# Patient Record
Sex: Male | Born: 1960 | Race: White | Hispanic: No | Marital: Married | Smoking: Former smoker
Health system: Southern US, Community
[De-identification: ages and names within clinical notes are randomized; demographics above are authoritative.]

## PROBLEM LIST (undated history)

## (undated) DIAGNOSIS — F419 Anxiety disorder, unspecified: Secondary | ICD-10-CM

---

## 2003-12-30 ENCOUNTER — Ambulatory Visit: Payer: Self-pay | Admitting: Internal Medicine

## 2004-01-08 ENCOUNTER — Ambulatory Visit: Payer: Self-pay | Admitting: Internal Medicine

## 2004-03-09 ENCOUNTER — Ambulatory Visit: Payer: Self-pay | Admitting: Gastroenterology

## 2004-03-16 ENCOUNTER — Ambulatory Visit: Payer: Self-pay | Admitting: Gastroenterology

## 2004-09-14 ENCOUNTER — Ambulatory Visit: Payer: Self-pay | Admitting: Internal Medicine

## 2005-07-16 ENCOUNTER — Ambulatory Visit: Payer: Self-pay | Admitting: Internal Medicine

## 2005-12-20 ENCOUNTER — Ambulatory Visit: Payer: Self-pay | Admitting: Internal Medicine

## 2006-08-30 ENCOUNTER — Ambulatory Visit: Payer: Self-pay | Admitting: Internal Medicine

## 2006-09-09 ENCOUNTER — Encounter: Admission: RE | Admit: 2006-09-09 | Discharge: 2006-11-14 | Payer: Self-pay | Admitting: Internal Medicine

## 2007-05-22 DIAGNOSIS — G43909 Migraine, unspecified, not intractable, without status migrainosus: Secondary | ICD-10-CM | POA: Insufficient documentation

## 2007-05-22 DIAGNOSIS — K644 Residual hemorrhoidal skin tags: Secondary | ICD-10-CM | POA: Insufficient documentation

## 2007-05-22 DIAGNOSIS — G4700 Insomnia, unspecified: Secondary | ICD-10-CM | POA: Insufficient documentation

## 2007-05-22 DIAGNOSIS — I10 Essential (primary) hypertension: Secondary | ICD-10-CM | POA: Insufficient documentation

## 2007-05-26 ENCOUNTER — Encounter: Payer: Self-pay | Admitting: Internal Medicine

## 2008-03-08 ENCOUNTER — Ambulatory Visit: Payer: Self-pay | Admitting: Internal Medicine

## 2008-03-12 ENCOUNTER — Encounter: Payer: Self-pay | Admitting: Internal Medicine

## 2008-05-23 ENCOUNTER — Telehealth: Payer: Self-pay | Admitting: Internal Medicine

## 2008-07-02 ENCOUNTER — Ambulatory Visit (HOSPITAL_BASED_OUTPATIENT_CLINIC_OR_DEPARTMENT_OTHER): Admission: RE | Admit: 2008-07-02 | Discharge: 2008-07-02 | Payer: Self-pay | Admitting: Internal Medicine

## 2008-07-02 ENCOUNTER — Telehealth: Payer: Self-pay | Admitting: Internal Medicine

## 2008-07-02 ENCOUNTER — Ambulatory Visit: Payer: Self-pay | Admitting: Internal Medicine

## 2008-07-02 ENCOUNTER — Ambulatory Visit: Payer: Self-pay | Admitting: Diagnostic Radiology

## 2008-07-02 DIAGNOSIS — M79609 Pain in unspecified limb: Secondary | ICD-10-CM | POA: Insufficient documentation

## 2008-09-17 ENCOUNTER — Ambulatory Visit: Payer: Self-pay | Admitting: Diagnostic Radiology

## 2008-09-17 ENCOUNTER — Emergency Department (HOSPITAL_BASED_OUTPATIENT_CLINIC_OR_DEPARTMENT_OTHER): Admission: EM | Admit: 2008-09-17 | Discharge: 2008-09-17 | Payer: Self-pay | Admitting: Emergency Medicine

## 2008-11-25 ENCOUNTER — Ambulatory Visit: Payer: Self-pay | Admitting: Internal Medicine

## 2008-11-25 ENCOUNTER — Ambulatory Visit: Payer: Self-pay | Admitting: Diagnostic Radiology

## 2008-11-25 ENCOUNTER — Ambulatory Visit (HOSPITAL_BASED_OUTPATIENT_CLINIC_OR_DEPARTMENT_OTHER): Admission: RE | Admit: 2008-11-25 | Discharge: 2008-11-25 | Payer: Self-pay | Admitting: Internal Medicine

## 2008-11-25 ENCOUNTER — Telehealth: Payer: Self-pay | Admitting: Internal Medicine

## 2008-12-02 ENCOUNTER — Encounter: Payer: Self-pay | Admitting: Internal Medicine

## 2009-02-11 ENCOUNTER — Encounter (INDEPENDENT_AMBULATORY_CARE_PROVIDER_SITE_OTHER): Payer: Self-pay | Admitting: *Deleted

## 2009-03-01 HISTORY — PX: KNEE ARTHROSCOPY: SUR90

## 2009-10-14 ENCOUNTER — Telehealth: Payer: Self-pay | Admitting: Gastroenterology

## 2009-10-30 ENCOUNTER — Ambulatory Visit: Payer: Self-pay | Admitting: Internal Medicine

## 2009-10-30 DIAGNOSIS — S335XXA Sprain of ligaments of lumbar spine, initial encounter: Secondary | ICD-10-CM | POA: Insufficient documentation

## 2009-11-11 ENCOUNTER — Ambulatory Visit: Payer: Self-pay | Admitting: Family

## 2009-11-11 ENCOUNTER — Telehealth: Payer: Self-pay | Admitting: Family

## 2009-11-11 ENCOUNTER — Ambulatory Visit (HOSPITAL_BASED_OUTPATIENT_CLINIC_OR_DEPARTMENT_OTHER): Admission: RE | Admit: 2009-11-11 | Discharge: 2009-11-11 | Payer: Self-pay | Admitting: Internal Medicine

## 2009-11-11 ENCOUNTER — Ambulatory Visit: Payer: Self-pay | Admitting: Interventional Radiology

## 2009-11-11 DIAGNOSIS — L03039 Cellulitis of unspecified toe: Secondary | ICD-10-CM

## 2009-11-11 DIAGNOSIS — L02619 Cutaneous abscess of unspecified foot: Secondary | ICD-10-CM | POA: Insufficient documentation

## 2009-11-21 ENCOUNTER — Ambulatory Visit (HOSPITAL_BASED_OUTPATIENT_CLINIC_OR_DEPARTMENT_OTHER): Admission: RE | Admit: 2009-11-21 | Discharge: 2009-11-21 | Payer: Self-pay | Admitting: Internal Medicine

## 2009-11-21 ENCOUNTER — Ambulatory Visit: Payer: Self-pay | Admitting: Family

## 2009-11-21 ENCOUNTER — Ambulatory Visit: Payer: Self-pay | Admitting: Diagnostic Radiology

## 2010-02-03 ENCOUNTER — Encounter: Payer: Self-pay | Admitting: Internal Medicine

## 2010-03-29 LAB — CONVERTED CEMR LAB
ALT: 54 units/L — ABNORMAL HIGH (ref 0–53)
AST: 36 units/L (ref 0–37)
Basophils Absolute: 0 10*3/uL (ref 0.0–0.1)
Bilirubin, Direct: 0.2 mg/dL (ref 0.0–0.3)
CO2: 34 meq/L — ABNORMAL HIGH (ref 19–32)
Chloride: 107 meq/L (ref 96–112)
Lymphocytes Relative: 33.5 % (ref 12.0–46.0)
MCHC: 35.6 g/dL (ref 30.0–36.0)
Neutrophils Relative %: 52.9 % (ref 43.0–77.0)
Potassium: 4.6 meq/L (ref 3.5–5.1)
RDW: 11.9 % (ref 11.5–14.6)
Sodium: 145 meq/L (ref 135–145)
TSH: 2.19 microintl units/mL (ref 0.35–5.50)
Total Bilirubin: 2.1 mg/dL — ABNORMAL HIGH (ref 0.3–1.2)

## 2010-03-31 NOTE — Progress Notes (Signed)
Summary: u/s results  Phone Note Outgoing Call Call back at cell [phone]   Summary of Call: Please call Adam Stewart at above cell number with u/s results. Nicki Guadalajara Fergerson CMA Duncan Dull)  November 11, 2009 10:48 AM   Follow-up for Phone Call        ultrasound was negative for DVT. Pls notify patient. Follow-up by: Lemont Fillers FNP,  November 11, 2009 3:30 PM  Additional Follow-up for Phone Call Additional follow up Details #1::        Pt notified. Nicki Guadalajara Fergerson CMA Duncan Dull)  November 11, 2009 3:38 PM

## 2010-03-31 NOTE — Assessment & Plan Note (Signed)
Summary: TOE IS PURPLE/MHF   Vital Signs:  Patient profile:   50 year old male Weight:      221.25 pounds BMI:     32.79 O2 Sat:      98 % on Room air Temp:     98.2 degrees F oral Pulse rate:   87 / minute Pulse rhythm:   regular Resp:     18 per minute BP sitting:   130 / 90  (right arm) Cuff size:   large  Vitals Entered By: Glendell Docker CMA (November 21, 2009 3:21 PM)  O2 Flow:  Room air CC: follow-up visit Is Patient Diabetic? No Pain Assessment Patient in pain? no      Comments Evaluation of left middle, it has purple for the past 3 days, pain to touch, when preesure applied   Primary Care Provider:  D. Thomos Lemons DO  CC:  follow-up visit.  History of Present Illness: Adam Stewart is a 50 year old male who presents today for follow up of his left 3rd toe cellulitis.  He was originally seen on 9/13 and was treated with Keflex.  He also underwent LLE doppler which was negative for DVT.  Since that time he notes that the toe remains swollen and is purple in color.  He denies fever, drainage or pain- unless the toe is tightly squeezed.    Preventive Screening-Counseling & Management  Alcohol-Tobacco     Smoking Status: quit  Allergies: 1)  ! Wellbutrin  Past History:  Past Medical History: Last updated: 10/30/2009 Insomnia      Past Surgical History: Last updated: 10/30/2009 colonoscopy 2006    Physical Exam  General:  Well-developed,well-nourished,in no acute distress; alert,appropriate and cooperative throughout examination Pulses:  2+DP/PT pulse LLE.  Brisk cap refill of all toes on LLE including affected toe. Extremities:  left 3rd toe is mildly swollen, + light red/purple erythema is noted.  Some dry skin noted at base of nail bed.  Full range of motion Neurologic:  alert & oriented X3.     Impression & Recommendations:  Problem # 1:  CELLULITIS, TOE (ICD-681.10) Assessment Improved Clinically improving, suspect that the residual discoloration  is due to recent cellulitis.  X-ray performed today does not indicated osteomyelitis.  Plan round of Bactrim for empiric coverage of MRSA.  Case reviewed with Dr. Artist Pais. The following medications were removed from the medication list:    Keflex 500 Mg Caps (Cephalexin) ..... One tablet by mouth every 6 hours x 7 days. His updated medication list for this problem includes:    Bactrim Ds 800-160 Mg Tabs (Sulfamethoxazole-trimethoprim) ..... One tablet by mouth two times a day x 7 days  Orders: Foot Left Min 3 Views ([id number])  Complete Medication List: 1)  Amitriptyline Hcl 25 Mg Tabs (Amitriptyline hcl) .... Take 1 tab by mouth at bedtime as needed 2)  Low-dose Aspirin 81 Mg Tabs (Aspirin) .... Take 1 tablet by mouth once a day 3)  Vitamin D 400 Unit Caps (Cholecalciferol) .... Take 1 capsule by mouth once a day. 4)  B Complex Tabs (B complex vitamins) .... Take 1 tablet by mouth once a day 5)  Fish Oil 1000 Mg Caps (Omega-3 fatty acids) .... Take 1 capsule by mouth once a day. 6)  Daily Multiple Vitamins Tabs (Multiple vitamin) .... Take 1 tablet by mouth once a day. 7)  Bactrim Ds 800-160 Mg Tabs (Sulfamethoxazole-trimethoprim) .... One tablet by mouth two times a day x 7 days  Patient  Instructions: 1)  Please call if symptoms worsen, or if redness/swelling does not completely resolve in the next few weeks. Prescriptions: BACTRIM DS 800-160 MG TABS (SULFAMETHOXAZOLE-TRIMETHOPRIM) one tablet by mouth two times a day x 7 days  #14 x 0   Entered and Authorized by:   Adam Fillers FNP   Signed by:   Adam Fillers FNP on 11/21/2009   Method used:   Electronically to        [facility]  [address] (retail)       [address]       [place], [place]  [address]       Ph: [phone]       Fax: [phone]   RxID:   [id number]      Patient Instructions: 1)  Please call if symptoms worsen, or if redness/swelling does not completely resolve in the next few  weeks.   Current Allergies (reviewed today): ! [facility]   Immunization History:  Influenza Immunization History:    Influenza:  declined (11/21/2009)   Contraindications/Deferment of Procedures/Staging:    Test/Procedure: FLU VAX    Reason for deferment: patient declined

## 2010-03-31 NOTE — Assessment & Plan Note (Signed)
Summary: spider bite on toe/mhf--Rm 5   Vital Signs:  Patient profile:   50 year old male Height:      69 inches Weight:      221.50 pounds BMI:     32.83 Temp:     98.1 degrees F oral Pulse rate:   78 / minute Pulse rhythm:   regular Resp:     16 per minute BP sitting:   134 / 86  (right arm) Cuff size:   large  Vitals Entered By: Mervin Kung CMA (AAMA) (November 11, 2009 10:22 AM) CC: Rm 5  Left middle toe swollen x 2 days. Now has pain radiating up into calf. Is Patient Diabetic? No Comments Pt has completed prednisone and tramadol. Nicki Guadalajara Fergerson CMA Duncan Dull)  November 11, 2009 10:28 AM    Primary Care Provider:  Dondra Spry DO  CC:  Rm 5  Left middle toe swollen x 2 days. Now has pain radiating up into calf..  History of Present Illness: Adam Stewart is a 50 year old male who presents today with complaint of pain and swelling left middle toe.  These symptoms started 2 days ago.  Patient denies fever or drainage.  Reports that now he is having pain which radiates up the left calf.  Pain is cramping in nature and is worse with standing.   Denies SOB or chest pain.    Allergies: 1)  ! Wellbutrin  Past History:  Past Medical History: Last updated: 10/30/2009 Insomnia      Past Surgical History: Last updated: 10/30/2009 colonoscopy 2006    Review of Systems       see HPI  Physical Exam  General:  Well-developed,well-nourished,in no acute distress; alert,appropriate and cooperative throughout examination Head:  Normocephalic and atraumatic without obvious abnormalities. No apparent alopecia or balding. Msk:  No calf swelling or tenderness to palpation, but + homan's sign on left. Extremities:  Left middle toe with small scab at base of nail bed (no drainage) Toe is swollen, tender and erythematous. Erythema extends onto foot about 1 inch beyond base if toe.     Impression & Recommendations:  Problem # 1:  CELLULITIS, TOE (ICD-681.10) Assessment New Scab  at base of nail bed was likely portal of entry for infection. Will plan to treat with Keflex.  Due to left calf pain and + Homan's sign, a LLE dopper was performedd which was negative for DVT.  Pt to f/u on Friday, sooner if symptoms worsen or do not improve.  Orders: Misc. Referral (Misc. Ref)  His updated medication list for this problem includes:    Keflex 500 Mg Caps (Cephalexin) ..... One tablet by mouth every 6 hours x 7 days.  Complete Medication List: 1)  Amitriptyline Hcl 25 Mg Tabs (Amitriptyline hcl) .... Take 1 tab by mouth at bedtime as needed 2)  Low-dose Aspirin 81 Mg Tabs (Aspirin) .... Take 1 tablet by mouth once a day 3)  Vitamin D 400 Unit Caps (Cholecalciferol) .... Take 1 capsule by mouth once a day. 4)  B Complex Tabs (B complex vitamins) .... Take 1 tablet by mouth once a day 5)  Fish Oil 1000 Mg Caps (Omega-3 fatty acids) .... Take 1 capsule by mouth once a day. 6)  Daily Multiple Vitamins Tabs (Multiple vitamin) .... Take 1 tablet by mouth once a day. 7)  Keflex 500 Mg Caps (Cephalexin) .... One tablet by mouth every 6 hours x 7 days.  Patient Instructions: 1)  Please complete your  ultrasound downstairs today. 2)  We will call you with the results. 3)  Start antibiotics.   4)  Call if fever over 101, increasing redness, pain, fever, or swelling.  5)  Follow up on Friday with Dr. Artist Pais- sooner if symptoms worsen. Prescriptions: KEFLEX 500 MG CAPS (CEPHALEXIN) one tablet by mouth every 6 hours x 7 days.  #28 x 0   Entered and Authorized by:   Lemont Fillers FNP   Signed by:   Lemont Fillers FNP on 11/11/2009   Method used:   Electronically to        [facility] [address] (retail)       [phone]       [place], [place]  [address]       Ph: [phone]       Fax: [phone]   RxID:   [id number]   Current Allergies (reviewed today): ! [facility]

## 2010-03-31 NOTE — Assessment & Plan Note (Signed)
Summary: low back pain/dt   Vital Signs:  Patient profile:   50 year old male Weight:      223.25 pounds BMI:     33.09 O2 Sat:      99 % on 0.25 L/min Temp:     98.3 degrees F oral Pulse rate:   85 / minute Pulse rhythm:   regular Resp:     16 per minute BP sitting:   116 / 80  (right arm) Cuff size:   large  Vitals Entered By: Glendell Docker CMA (September  49, 2011 1:14 PM)  O2 Flow:  0.25 L/min CC: Back Pain, Back pain Is Patient Diabetic? No Pain Assessment Patient in pain? yes     Location: lower back Intensity: 7 Type: aching Onset of pain  Constant Comments states he was pulling weeds in his yard 2 weeks ago and felt a sharp pain, taken tylenol used pain patch and heat all with no imrpovement. patient is requesting refill amitriptyline   Primary Care Provider:  DThomos Lemons DO  CC:  Back Pain and Back pain.  History of Present Illness:   Back Pain      This is a 50 year old man who presents with Back pain.  The patient denies fever, chills, weakness, loss of sensation, and dysuria.  The pain is located in the mid low back.  The pain began at home and after lifting.  The pain is made worse by standing or walking and flexion.    Preventive Screening-Counseling & Management  Alcohol-Tobacco     Smoking Status: quit  Allergies: 1)  ! 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   Lungs:  normal respiratory effort and normal breath sounds.   Heart:  normal rate, regular rhythm, and no gallop.   Msk:  lumbar spine    Impression &  Recommendations:  Problem # 1:  BACK STRAIN, LUMBAR (ICD-847.2) acute lumbar strain after lifting.  tx with pred taper.  use tramadol as directed.  avoid lifting x 1-2 weeks. encouraged walking If persistent or worsening symptoms, consider MRI of LS spine.  Problem # 2:  INSOMNIA (ICD-780.52) Assessment: Unchanged good response to low dose amitriptyline.  continue to use as needed  Complete Medication List: 1)  Amitriptyline Hcl 25 Mg Tabs (Amitriptyline hcl) .... Take 1 tab by mouth at bedtime as needed 2)  Low-dose Aspirin 81 Mg Tabs (Aspirin) .... Take 1 tablet by mouth once a day 3)  Prednisone 10 Mg Tabs (Prednisone) .... 4 tabs by mouth q.d. x 3 days, 3 tabs by mouth q.d. x 3 days, 2 tabs p.o. once daily x 3 days, 1 tab p.o. x 3 days 4)  Tramadol Hcl 50 Mg Tabs (Tramadol hcl) .... One by mouth two times a day prn  Patient Instructions: 1)  Call our office if your symptoms do not  improve or gets worse. Prescriptions: TRAMADOL HCL 50 MG TABS (TRAMADOL HCL) one by mouth two times a day prn  #30 x 0   Entered and  Authorized by:   D. Thomos Lemons DO   Signed by:   D. Thomos Lemons DO on 10/30/2009   Method used:   Print then Give to Patient   RxID:   [id number] AMITRIPTYLINE HCL 25 MG  TABS (AMITRIPTYLINE HCL) Take 1 tab by mouth at bedtime as needed  #30 x 5   Entered and Authorized by:   D. Thomos Lemons DO   Signed by:   D. Thomos Lemons DO on 10/30/2009   Method used:   Print then Give to Patient   RxID:   [id number] PREDNISONE 10 MG TABS (PREDNISONE) 4 tabs by mouth q.d. x 3 days, 3 tabs by mouth q.d. x 3 days, 2 tabs p.o. once daily x 3 days, 1 tab p.o. x 3 days  #18 x 0   Entered and Authorized by:   D. Thomos Lemons DO   Signed by:   D. Thomos Lemons DO on 10/30/2009   Method used:   Print then Give to Patient   RxID:   [id number]   Current Allergies (reviewed today): ! [facility]

## 2010-03-31 NOTE — Progress Notes (Signed)
Summary: Schedule Colonoscopy   Phone Note Outgoing Call Call back at [facility] Phone [phone]   Call placed by: Harlow Mares CMA Duncan Dull),  October 14, 2009 10:31 AM Call placed to: Patient Summary of Call: Left message on patients machine to call back. patient is due for a colonoscopy  Initial call taken by: Harlow Mares CMA Duncan Dull),  October 14, 2009 10:32 AM  Follow-up for Phone Call        Left a message on the patient machine to call back and schedule a previsit and procedure with our office. A letter will be mailed to the patient.   Follow-up by: Harlow Mares CMA Duncan Dull),  October 20, 2009 9:33 AM

## 2010-04-02 NOTE — Letter (Signed)
Summary: [facility]  [facility]   Imported By: Sherian Rein 02/09/2010 14:24:20  _____________________________________________________________________  External Attachment:    Type:   Image     Comment:   External Document

## 2010-06-22 ENCOUNTER — Encounter: Payer: Self-pay | Admitting: Family

## 2010-06-22 ENCOUNTER — Ambulatory Visit (INDEPENDENT_AMBULATORY_CARE_PROVIDER_SITE_OTHER): Payer: BC Managed Care – PPO | Admitting: Family

## 2010-06-22 VITALS — BP 136/86 | HR 78 | Temp 97.0°F | Resp 16 | Wt 221.0 lb

## 2010-06-22 DIAGNOSIS — J329 Chronic sinusitis, unspecified: Secondary | ICD-10-CM

## 2010-06-22 MED ORDER — AMITRIPTYLINE HCL 25 MG PO TABS: 25.0000 mg | ORAL_TABLET | Freq: Every day | ORAL | Status: DC

## 2010-06-22 MED ORDER — CEFUROXIME AXETIL 500 MG PO TABS: 500.0000 mg | ORAL_TABLET | Freq: Two times a day (BID) | ORAL | Status: AC

## 2010-06-22 NOTE — Patient Instructions (Signed)

## 2010-06-22 NOTE — Assessment & Plan Note (Signed)
50 yr old male with sinus pressure, yellow drainage x 3 weeks.  Will plan to treat with ceftin. Pt instructed to continue an antihistamine and discontinue use of afrin.  Pt verbalizes understanding.

## 2010-06-22 NOTE — Progress Notes (Signed)
  Subjective:    Patient ID: Marcene Corning Lallier, male    DOB: Dec 03, 1960, 50 y.o.   MRN: [id number]  HPI  Mr.  Quant is a 50 yr old male who presents with chief complaint of sinus pressure x 3 weeks.  He has tried Careers adviser, Zyrtec, pseudofed without improvement.  He has also been using Afrin which helps with congestion.  Denies associated fever.  + pressure behind his eyes.  Initially,had bloody drainage. Now has turned yellow.  Denies associated cough.    Review of Systems See HPI  No past medical history on file.  History   Social History  . Marital Status: Married    Spouse Name: N/A    Number of Children: N/A  . Years of Education: N/A   Occupational History  . Not on file.   Social History Main Topics  . Smoking status: Never Smoker   . Smokeless tobacco: Never Used  . Alcohol Use: Not on file  . Drug Use: Not on file  . Sexually Active: Not on file   Other Topics Concern  . Not on file   Social History Narrative  . No narrative on file    No past surgical history on file.  No family history on file.  Allergies  Allergen Reactions  . Bupropion Hcl     REACTION: Hives    No current outpatient prescriptions on file prior to visit.    BP 136/86  Pulse 78  Temp(Src) 97 F (36.1 C) (Oral)  Resp 16  Wt 221 lb (100.245 kg)  SpO2 99%       Objective:   Physical Exam  Constitutional: He appears well-developed and well-nourished.  HENT:  Head: Normocephalic and atraumatic.  Right Ear: Tympanic membrane normal.  Left Ear: Tympanic membrane normal.  Mouth/Throat: Oropharynx is clear and moist and mucous membranes are normal.  Neck: Normal range of motion. Neck supple.  Cardiovascular: Normal rate and regular rhythm.   Pulmonary/Chest: Effort normal and breath sounds normal.          Assessment & Plan:

## 2010-08-31 ENCOUNTER — Encounter: Payer: Self-pay | Admitting: Family

## 2010-08-31 ENCOUNTER — Ambulatory Visit (INDEPENDENT_AMBULATORY_CARE_PROVIDER_SITE_OTHER): Payer: BC Managed Care – PPO | Admitting: Family

## 2010-08-31 VITALS — BP 120/80 | HR 66 | Temp 98.0°F | Resp 16 | Ht 69.02 in | Wt 209.0 lb

## 2010-08-31 DIAGNOSIS — M79609 Pain in unspecified limb: Secondary | ICD-10-CM

## 2010-08-31 DIAGNOSIS — M79601 Pain in right arm: Secondary | ICD-10-CM | POA: Insufficient documentation

## 2010-08-31 MED ORDER — MELOXICAM 7.5 MG PO TABS: 7.5000 mg | ORAL_TABLET | Freq: Every day | ORAL | Status: DC

## 2010-08-31 NOTE — Progress Notes (Signed)
  Subjective:    Patient ID: Adam Stewart, male    DOB: [birth year], 50 y.o.   MRN: [id number]  HPI  Adam Stewart is a 50 yr old male who presents today with chief complaint of pain in his right forearm.  Pain first started about 3 weeks ago. Notes that pain initially was intermittent in nature, now constant.  Pain is at the top of the right forearm.  He has been using some tramadol and motrin without improvement.  Pain improves with rest.  Denies fever, swelling or redness of the right arm. Trying to exercise regularly, active.  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  He is right hand dominant.  Denies known injury.     Review of Systems See HPI  Past Medical History  Diagnosis Date  . Routine general medical examination at a health care facility     History   Social History  . Marital Status: Married    Spouse Name: N/A    Number of Children: 1  . Years of Education: N/A   Occupational History  . 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 Smoking status: Never Smoker   . Smokeless tobacco: Never Used  . Alcohol Use: Yes  . Drug Use: Not on file  . Sexually Active: Not on file   Other Topics Concern  . Not on file   Social History Narrative   1 biological child, 2 step children    No past surgical history on file.  Family History  Problem Relation Age of Onset  . Cancer Mother     breast  . Diabetes Brother     type II  . Cancer Other     lung    Allergies  Allergen Reactions  . Bupropion Hcl     REACTION: Hives    Current Outpatient Prescriptions on File Prior to Visit  Medication Sig Dispense Refill  . amitriptyline (ELAVIL) 25 MG tablet Take 1 tablet (25 mg total) by mouth at bedtime.  30 tablet  2    BP 120/80  Pulse 66  Temp(Src) 98 F (36.7 C) (Oral)  Resp 16  Ht 5' 9.02" (1.753 m)  Wt 209 lb 0.6 oz (94.82 kg)  BMI 30.86 kg/m2       Objective:   Physical Exam  Constitutional: He appears well-developed and  well-nourished.  Cardiovascular: Normal rate and regular rhythm.   Pulmonary/Chest: Effort normal and breath sounds normal.  Abdominal: Soft. Bowel sounds are normal.  Musculoskeletal:       + tenderness of right forearm beneath right AC fossa.  Increased pain with "empty can" maneuver right.  No swelling.  Stength bilateral UE is 5/5.           Assessment & Plan:

## 2010-08-31 NOTE — Patient Instructions (Signed)
You will be contacted about your referral to Dr. Hudnall.   

## 2010-08-31 NOTE — Assessment & Plan Note (Signed)
Suspect musculoskeletal strain, however it has not improved over the last 3 weeks. Will add mobic and plan referral to [facility], sports medicine for further evaluation.

## 2010-11-02 ENCOUNTER — Other Ambulatory Visit: Payer: Self-pay | Admitting: Family

## 2010-12-14 ENCOUNTER — Other Ambulatory Visit: Payer: Self-pay | Admitting: Family

## 2010-12-14 NOTE — Telephone Encounter (Signed)
OK to send 30 tabs with 1 refill.

## 2010-12-14 NOTE — Telephone Encounter (Signed)
Rx refill sent to pharmacy. 

## 2010-12-14 NOTE — Telephone Encounter (Signed)
Is it okay to provide a medication refill for this patient?

## 2010-12-15 ENCOUNTER — Encounter: Payer: Self-pay | Admitting: Family

## 2010-12-15 ENCOUNTER — Ambulatory Visit (INDEPENDENT_AMBULATORY_CARE_PROVIDER_SITE_OTHER): Payer: BC Managed Care – PPO | Admitting: Family

## 2010-12-15 VITALS — BP 130/88 | HR 78 | Temp 98.4°F | Resp 16 | Ht 69.0 in | Wt 216.1 lb

## 2010-12-15 DIAGNOSIS — T148XXA Other injury of unspecified body region, initial encounter: Secondary | ICD-10-CM | POA: Insufficient documentation

## 2010-12-15 MED ORDER — CYCLOBENZAPRINE HCL 5 MG PO TABS: 5.0000 mg | ORAL_TABLET | Freq: Every evening | ORAL | Status: AC | PRN

## 2010-12-15 MED ORDER — MELOXICAM 7.5 MG PO TABS: 7.5000 mg | ORAL_TABLET | Freq: Every day | ORAL | Status: DC

## 2010-12-15 NOTE — Patient Instructions (Signed)
Call if your symptoms worsen or if you are not feeling better in 2 weeks.

## 2010-12-15 NOTE — Assessment & Plan Note (Addendum)
Suspect strain of the right trapezius.  Recommended short term use of NSAIDS, heating pad PRN, flexeril HS prn.  If symptoms worsen or do not improve will plan to refer to Sports medicine.

## 2010-12-15 NOTE — Progress Notes (Signed)
  Subjective:    Patient ID: Adam Stewart, male    DOB: 1960/10/01, 50 y.o.   MRN: [id number]  HPI  Adam Stewart is a 50 yr old male who presents today with complaint of right sided back/shoulder pain.  Using heating pad, ice pack, tylenol, ibuprofen.  He works in Consulting civil engineer.  Symptoms started on on 10/13 when he tried to get up off of the couch.  Notes that he notices the pain the most with movement of the left shoulder/arm.   Review of Systems See HPI  Past Medical History  Diagnosis Date  . Routine general medical examination at a health care facility     History   Social History  . Marital Status: Married    Spouse Name: N/A    Number of Children: 1  . Years of Education: N/A   Occupational History  . 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 Smoking status: Never Smoker   . Smokeless tobacco: Never Used  . Alcohol Use: Yes  . Drug Use: Not on file  . Sexually Active: Not on file   Other Topics Concern  . Not on file   Social History Narrative   1 biological child, 2 step children    No past surgical history on file.  Family History  Problem Relation Age of Onset  . Cancer Mother     breast  . Diabetes Brother     type II  . Cancer Other     lung    Allergies  Allergen Reactions  . Bupropion Hcl     REACTION: Hives    Current Outpatient Prescriptions on File Prior to Visit  Medication Sig Dispense Refill  . amitriptyline (ELAVIL) 25 MG tablet TAKE ONE TABLET BY MOUTH NIGHTLY AT BEDTIME  30 tablet  1    BP 130/88  Pulse 78  Temp(Src) 98.4 F (36.9 C) (Oral)  Resp 16  Ht 5\' 9"  (1.753 m)  Wt 216 lb 1.9 oz (98.031 kg)  BMI 31.92 kg/m2       Objective:   Physical Exam  Constitutional: He appears well-developed and well-nourished. No distress.  HENT:  Head: Normocephalic and atraumatic.  Cardiovascular: Normal rate and regular rhythm.   No murmur heard. Pulmonary/Chest: Effort normal and breath sounds normal. No  respiratory distress. He has no wheezes. He has no rales. He exhibits no tenderness.  Musculoskeletal:       Bilateral upper extremity strength is 5/5.  Full ROM of both shoulders.  Mild tenderness to palpation overlying the right scapula.            Assessment & Plan:

## 2011-02-17 ENCOUNTER — Other Ambulatory Visit: Payer: Self-pay | Admitting: Family

## 2011-02-17 NOTE — Telephone Encounter (Signed)
Rx refill sent to pharmacy. 

## 2011-05-11 ENCOUNTER — Ambulatory Visit (INDEPENDENT_AMBULATORY_CARE_PROVIDER_SITE_OTHER): Payer: BC Managed Care – PPO | Admitting: Internal Medicine

## 2011-05-11 ENCOUNTER — Encounter: Payer: Self-pay | Admitting: Internal Medicine

## 2011-05-11 VITALS — BP 120/80 | HR 88 | Temp 97.9°F | Resp 16

## 2011-05-11 DIAGNOSIS — J329 Chronic sinusitis, unspecified: Secondary | ICD-10-CM

## 2011-05-11 MED ORDER — METHYLPREDNISOLONE 4 MG PO KIT: PACK | ORAL | Status: AC

## 2011-05-11 MED ORDER — LEVOFLOXACIN 500 MG PO TABS: 500.0000 mg | ORAL_TABLET | Freq: Every day | ORAL | Status: AC

## 2011-05-11 NOTE — Progress Notes (Signed)
  Subjective:    Patient ID: Adam Stewart, male    DOB: 1960-06-24, 51 y.o.   MRN: [id number]  HPI Pt presents to clinic for evaluation of nasal congestion. Notes ~41month h/o intermittent nasal congestion and drainage. Drainage is intermittently yellow and blood tinged. No epistaxis. Congestion worse at night and has to use afrin spray. Feels like has been having sinus headaches and maxillary/paranasal areas may feel full. No fever, chills or tooth pain. Denies chronic sinusitis sx's otherwise. No frequent episodes of acute sinusitis. Has taken otc AH without improvement. No other alleviating or exacerbating factors. No other complaints.  Past Medical History  Diagnosis Date  . Routine general medical examination at a health care facility    No past surgical history on file.  reports that he has quit smoking. He has never used smokeless tobacco. He reports that he drinks alcohol. His drug history not on file. family history includes Cancer in his mother and other and Diabetes in his brother. Allergies  Allergen Reactions  . Bupropion Hcl     REACTION: Hives     Review of Systems see hpi     Objective:   Physical Exam  Nursing note and vitals reviewed. Constitutional: He appears well-developed and well-nourished. No distress.  HENT:  Head: Normocephalic and atraumatic.  Right Ear: External ear normal.  Left Ear: External ear normal.  Nose: Mucosal edema and rhinorrhea present. Right sinus exhibits maxillary sinus tenderness. Left sinus exhibits maxillary sinus tenderness.  Mouth/Throat: Oropharynx is clear and moist. No oropharyngeal exudate.       Significant bilateral muscosal edema.  Eyes: Conjunctivae and EOM are normal. Right eye exhibits no discharge. Left eye exhibits no discharge. No scleral icterus.  Neck: Neck supple.  Pulmonary/Chest: Effort normal and breath sounds normal.  Neurological: He is alert.  Skin: He is not diaphoretic.  Psychiatric: He has a normal mood  and affect.          Assessment & Plan:

## 2011-05-16 ENCOUNTER — Other Ambulatory Visit: Payer: Self-pay | Admitting: Family

## 2011-05-16 NOTE — Assessment & Plan Note (Signed)
Attempt levaquin and medrol. Followup if no improvement or worsening.

## 2011-05-24 ENCOUNTER — Encounter: Payer: Self-pay | Admitting: Gastroenterology

## 2011-05-27 ENCOUNTER — Encounter: Payer: Self-pay | Admitting: Gastroenterology

## 2011-06-25 ENCOUNTER — Ambulatory Visit (AMBULATORY_SURGERY_CENTER): Payer: BC Managed Care – PPO | Admitting: *Deleted

## 2011-06-25 ENCOUNTER — Encounter: Payer: Self-pay | Admitting: Gastroenterology

## 2011-06-25 VITALS — Ht 69.5 in | Wt 221.0 lb

## 2011-06-25 DIAGNOSIS — Z1211 Encounter for screening for malignant neoplasm of colon: Secondary | ICD-10-CM

## 2011-06-25 MED ORDER — PEG-KCL-NACL-NASULF-NA ASC-C 100 G PO SOLR: ORAL | Status: DC

## 2011-07-09 ENCOUNTER — Ambulatory Visit (AMBULATORY_SURGERY_CENTER): Payer: BC Managed Care – PPO | Admitting: Gastroenterology

## 2011-07-09 ENCOUNTER — Encounter: Payer: Self-pay | Admitting: Gastroenterology

## 2011-07-09 VITALS — BP 147/93 | HR 80 | Temp 97.9°F | Resp 15 | Ht 69.5 in | Wt 221.0 lb

## 2011-07-09 DIAGNOSIS — Z1211 Encounter for screening for malignant neoplasm of colon: Secondary | ICD-10-CM

## 2011-07-09 MED ORDER — SODIUM CHLORIDE 0.9 % IV SOLN: 500.0000 mL | INTRAVENOUS | Status: DC

## 2011-07-09 NOTE — Op Note (Signed)
[facility] [address] [place], [place]  [address]  COLONOSCOPY PROCEDURE REPORT  PATIENT:  Adam Stewart, Adam Stewart  MR#:  [id number] BIRTHDATE:  17-Feb-1961, 51 yrs. old  GENDER:  male ENDOSCOPIST:  Vania Rea. Jarold Motto, MD, [facility] REF. BY: PROCEDURE DATE:  07/09/2011 PROCEDURE:  Average-risk screening colonoscopy G0121 ASA CLASS:  Class II INDICATIONS:  Routine Risk Screening MEDICATIONS:   propofol (Diprivan) 300 mg IV  DESCRIPTION OF PROCEDURE:   After the risks and benefits and of the procedure were explained, informed consent was obtained. Digital rectal exam was performed and revealed no abnormalities. The LB [id number] endoscope was introduced through the anus and advanced to the cecum, which was identified by both the appendix and ileocecal valve.  The quality of the prep was Moviprep fair.  The instrument was then slowly withdrawn as the colon was fully examined. <<PROCEDUREIMAGES>>  FINDINGS:  No polyps or cancers were seen.  This was otherwise a normal examination of the colon.   Retroflexed views in the rectum revealed no abnormalities.    The scope was then withdrawn from the patient and the procedure completed.  COMPLICATIONS:  None ENDOSCOPIC IMPRESSION: 1) No polyps or cancers 2) Otherwise normal examination RECOMMENDATIONS: 1) Continue current colorectal screening recommendations for "routine risk" patients with a repeat colonoscopy in 10 years.  REPEAT EXAM:  No  ______________________________ Vania Rea. Jarold Motto, MD, [facility]  CC:  Sandford Craze FNP  n. Rosalie DoctorMarland Kitchen   Vania Rea. Zakeya Junker at 07/09/2011 02:33 PM  Dohmen, Lissa Hoard, [phone]

## 2011-07-09 NOTE — Progress Notes (Signed)
Patient did not experience any of the following events: a burn prior to discharge; a fall within the facility; wrong site/side/patient/procedure/implant event; or a hospital transfer or hospital admission upon discharge from the facility. (G8907) Patient did not have preoperative order for IV antibiotic SSI prophylaxis. (G8918)  

## 2011-07-09 NOTE — Patient Instructions (Signed)
Follow discharge instructions given today. Normal exam today. Repeat colonoscopy in 10 years.   YOU HAD AN ENDOSCOPIC PROCEDURE TODAY AT THE [facility]: Refer to the procedure report that was given to you for any specific questions about what was found during the examination.  If the procedure report does not answer your questions, please call your gastroenterologist to clarify.  If you requested that your care partner not be given the details of your procedure findings, then the procedure report has been included in a sealed envelope for you to review at your convenience later.  YOU SHOULD EXPECT: Some feelings of bloating in the abdomen. Passage of more gas than usual.  Walking can help get rid of the air that was put into your GI tract during the procedure and reduce the bloating. If you had a lower endoscopy (such as a colonoscopy or flexible sigmoidoscopy) you may notice spotting of blood in your stool or on the toilet paper. If you underwent a bowel prep for your procedure, then you may not have a normal bowel movement for a few days.  DIET: Your first meal following the procedure should be a light meal and then it is ok to progress to your normal diet.  A half-sandwich or bowl of soup is an example of a good first meal.  Heavy or fried foods are harder to digest and may make you feel nauseous or bloated.  Likewise meals heavy in dairy and vegetables can cause extra gas to form and this can also increase the bloating.  Drink plenty of fluids but you should avoid alcoholic beverages for 24 hours.  ACTIVITY: Your care partner should take you home directly after the procedure.  You should plan to take it easy, moving slowly for the rest of the day.  You can resume normal activity the day after the procedure however you should NOT DRIVE or use heavy machinery for 24 hours (because of the sedation medicines used during the test).    SYMPTOMS TO REPORT IMMEDIATELY: A gastroenterologist can  be reached at any hour.  During normal business hours, 8:30 AM to 5:00 PM Monday through Friday, call [phone].  After hours and on weekends, please call the GI answering service at [phone] who will take a message and have the physician on call contact you.   Following lower endoscopy (colonoscopy or flexible sigmoidoscopy):  Excessive amounts of blood in the stool  Significant tenderness or worsening of abdominal pains  Swelling of the abdomen that is new, acute  Fever of 100F or higher  FOLLOW UP: If any biopsies were taken you will be contacted by phone or by letter within the next 1-3 weeks.  Call your gastroenterologist if you have not heard about the biopsies in 3 weeks.  Our staff will call the home number listed on your records the next business day following your procedure to check on you and address any questions or concerns that you may have at that time regarding the information given to you following your procedure. This is a courtesy call and so if there is no answer at the home number and we have not heard from you through the emergency physician on call, we will assume that you have returned to your regular daily activities without incident.  SIGNATURES/CONFIDENTIALITY: You and/or your care partner have signed paperwork which will be entered into your electronic medical record.  These signatures attest to the fact that that the information above on your After Visit  Summary has been reviewed and is understood.  Full responsibility of the confidentiality of this discharge information lies with you and/or your care-partner.  

## 2011-07-09 NOTE — Progress Notes (Signed)
PROPOFOL PER K ROGERS CRNA. SEE SCANNED INTRA PROCEDURE REPORT. EWM 

## 2011-07-12 ENCOUNTER — Telehealth: Payer: Self-pay | Admitting: *Deleted

## 2011-07-12 NOTE — Telephone Encounter (Signed)
  Follow up Call-  Call back number 07/09/2011  Post procedure Call Back phone  # [phone]  Permission to leave phone message Yes     Patient questions:  Do you have a fever, pain , or abdominal swelling? no Pain Score  0 *  Have you tolerated food without any problems? yes  Have you been able to return to your normal activities? yes  Do you have any questions about your discharge instructions: Diet   no Medications  no Follow up visit  no  Do you have questions or concerns about your Care? no  Actions: * If pain score is 4 or above: No action needed, pain <4.

## 2011-07-16 ENCOUNTER — Other Ambulatory Visit: Payer: Self-pay | Admitting: Family

## 2011-07-16 NOTE — Telephone Encounter (Signed)
Please advise if ok to give refill and if so, how many?

## 2011-07-18 NOTE — Telephone Encounter (Signed)
OK to give 1 month supply with 2 refills.  Pls notify pt that he is due for a fasting physical.

## 2011-07-19 NOTE — Telephone Encounter (Signed)
30 day supply, 2 refills pls.

## 2011-07-19 NOTE — Telephone Encounter (Signed)
Refill sent to pharmacy.   

## 2011-07-19 NOTE — Telephone Encounter (Signed)
Refill last sent to pharmacy in March. Please advise re: refills.

## 2011-11-12 ENCOUNTER — Other Ambulatory Visit: Payer: Self-pay | Admitting: Family

## 2011-11-15 NOTE — Telephone Encounter (Signed)
Amitriptyline request [Last Rx 05.17.13 #30x2 (had OV w/Dr Rodena Medin 13.0865) Last PCP OV 10.16.12]/SLS Please advise.

## 2011-11-15 NOTE — Telephone Encounter (Signed)
Rx done; [facility] with contact name & number to inform pt, Rx to pharmacy-future refills will require prior OV, due for physical per Sandford Craze, NP/SLS

## 2011-11-15 NOTE — Telephone Encounter (Signed)
Ok to send #30 with 2 refills.  pls call pt and let him know he is due for cpx.

## 2012-07-26 ENCOUNTER — Encounter: Payer: Self-pay | Admitting: Family

## 2012-07-26 ENCOUNTER — Ambulatory Visit (INDEPENDENT_AMBULATORY_CARE_PROVIDER_SITE_OTHER): Payer: BC Managed Care – PPO | Admitting: Family

## 2012-07-26 ENCOUNTER — Ambulatory Visit (HOSPITAL_BASED_OUTPATIENT_CLINIC_OR_DEPARTMENT_OTHER)
Admission: RE | Admit: 2012-07-26 | Discharge: 2012-07-26 | Disposition: A | Discharge status: Home / Self Care | Payer: BC Managed Care – PPO | Source: Ambulatory Visit | Attending: Family | Admitting: Family

## 2012-07-26 VITALS — BP 126/90 | HR 75 | Temp 98.5°F | Resp 16 | Wt 226.0 lb

## 2012-07-26 DIAGNOSIS — M545 Low back pain, unspecified: Secondary | ICD-10-CM

## 2012-07-26 DIAGNOSIS — M51379 Other intervertebral disc degeneration, lumbosacral region without mention of lumbar back pain or lower extremity pain: Secondary | ICD-10-CM | POA: Insufficient documentation

## 2012-07-26 DIAGNOSIS — M5137 Other intervertebral disc degeneration, lumbosacral region: Secondary | ICD-10-CM | POA: Insufficient documentation

## 2012-07-26 MED ORDER — METHYLPREDNISOLONE 4 MG PO KIT: PACK | ORAL | Status: DC

## 2012-07-26 MED ORDER — CYCLOBENZAPRINE HCL 5 MG PO TABS: 5.0000 mg | ORAL_TABLET | Freq: Two times a day (BID) | ORAL | Status: DC | PRN

## 2012-07-26 NOTE — Assessment & Plan Note (Signed)
Recommended rx with medrol dose pak and flexeril ( he is instructed re: not to drive after taking flexeril).  Obtain x-ray of the lumbar spine.

## 2012-07-26 NOTE — Patient Instructions (Addendum)
Please complete your x ray on the first floor. Call if symptoms worsen, if you develop numbness/weakness, or if not improved in 1 week.

## 2012-07-26 NOTE — Progress Notes (Signed)
Subjective:    Patient ID: Adam Stewart, male    DOB: 1960/06/15, 52 y.o.   MRN: [id number]  HPI  Adam Stewart is a 52 yr old male with chief complaint of low back pain. Reports that pain started 2 days ago when he bent over to tie his shoes. Reports that he heard a "loud crack" in his back.  Started using a heating pad.  Pain worsened.  Pain is worse with movement.  Pain is non-radiating.  Denies lower extremity weakness.  Denies previous history of this type of back pain.  He has been using ibuprofen, tylenol, no improvement.  No improvement with old flector patch.  Review of Systems See HPI  Past Medical History  Diagnosis Date  . Routine general medical examination at a health care facility     History   Social History  . Marital Status: Married    Spouse Name: N/A    Number of Children: 1  . Years of Education: N/A   Occupational History  . 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 Smoking status: Former Games developer  . Smokeless tobacco: Never Used  . Alcohol Use: 5.0 oz/week    10 drink(s) per week  . Drug Use: No  . Sexually Active: Not on file   Other Topics Concern  . Not on file   Social History Narrative   1 biological child, 2 step children          Past Surgical History  Procedure Laterality Date  . Knee arthroscopy  2011    right    Family History  Problem Relation Age of Onset  . Cancer Mother     breast  . Breast cancer Mother   . Diabetes Brother     type II  . Cancer Other     lung    Allergies  Allergen Reactions  . Bupropion Hcl     REACTION: Hives    Current Outpatient Prescriptions on File Prior to Visit  Medication Sig Dispense Refill  . amitriptyline (ELAVIL) 25 MG tablet TAKE ONE TABLET BY MOUTH NIGHTLY AT BEDTIME  30 tablet  2  . aspirin 81 MG tablet Take 81 mg by mouth daily.        Marland Kitchen b complex vitamins tablet Take 1 tablet by mouth daily.        . cholecalciferol (VITAMIN D) 1000 UNITS  tablet Take 2,000 Units by mouth daily.       Providence Lanius CAPS Take 1 capsule by mouth daily.        . Multiple Vitamin (MULTIVITAMIN) tablet Take 1 tablet by mouth daily.         No current facility-administered medications on file prior to visit.    BP 126/90  Pulse 75  Temp(Src) 98.5 F (36.9 C) (Oral)  Resp 16  Wt 226 lb (102.513 kg)  BMI 32.91 kg/m2  SpO2 97%       Objective:   Physical Exam  Constitutional: He is oriented to person, place, and time. He appears well-developed and well-nourished. No distress.  Cardiovascular: Normal rate and regular rhythm.   No murmur heard. Pulmonary/Chest: Effort normal and breath sounds normal. No respiratory distress. He has no wheezes. He has no rales. He exhibits no tenderness.  Musculoskeletal: He exhibits no edema.  Bilateral LE strength is 5/5 + increased pain with right sided straight leg raise.   Neurological: He is alert and oriented to person, place,  and time.  Reflex Scores:      Patellar reflexes are 0 on the right side and 1+ on the left side. Psychiatric: He has a normal mood and affect. His behavior is normal. Judgment and thought content normal.          Assessment & Plan:

## 2012-10-17 ENCOUNTER — Encounter: Payer: Self-pay | Admitting: Family

## 2012-10-17 ENCOUNTER — Ambulatory Visit (INDEPENDENT_AMBULATORY_CARE_PROVIDER_SITE_OTHER): Payer: BC Managed Care – PPO | Admitting: Family

## 2012-10-17 VITALS — BP 132/92 | HR 79 | Temp 98.6°F | Ht 69.0 in | Wt 226.0 lb

## 2012-10-17 DIAGNOSIS — M25531 Pain in right wrist: Secondary | ICD-10-CM | POA: Insufficient documentation

## 2012-10-17 DIAGNOSIS — M25539 Pain in unspecified wrist: Secondary | ICD-10-CM

## 2012-10-17 DIAGNOSIS — G43909 Migraine, unspecified, not intractable, without status migrainosus: Secondary | ICD-10-CM

## 2012-10-17 LAB — URIC ACID: Uric Acid, Serum: 6 mg/dL (ref 4.0–7.8)

## 2012-10-17 MED ORDER — MELOXICAM 7.5 MG PO TABS: 7.5000 mg | ORAL_TABLET | Freq: Every day | ORAL | Status: DC

## 2012-10-17 NOTE — Progress Notes (Signed)
Subjective:    Patient ID: Adam Stewart, male    DOB: 19-Aug-1960, 52 y.o.   MRN: [id number]  HPI  Adam Stewart is a 52 yr old male who presents today with chief complaint of right wrist pain. Denies trauma to the right wrist. Reports that pain is worsened by twist movements such as opening a door. Pain has woken him up at night and he has needed to reposition his wrist for comfort. Denies personal or family hx of gout.    Migraines- reports that his migraines are well controlled. Has not had a migraine in 6-7 months.   Review of Systems See HPI    Past Medical History  Diagnosis Date  . Routine general medical examination at a health care facility     History   Social History  . Marital Status: Married    Spouse Name: N/A    Number of Children: 1  . Years of Education: N/A   Occupational History  . 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 Smoking status: Former Games developer  . Smokeless tobacco: Never Used  . Alcohol Use: 5.0 oz/week    10 drink(s) per week  . Drug Use: No  . Sexual Activity: Not on file   Other Topics Concern  . Not on file   Social History Narrative   1 biological child, 2 step children          Past Surgical History  Procedure Laterality Date  . Knee arthroscopy  2011    right    Family History  Problem Relation Age of Onset  . Cancer Mother     breast  . Breast cancer Mother   . Diabetes Brother     type II  . Cancer Other     lung    Allergies  Allergen Reactions  . Bupropion Hcl     REACTION: Hives    Current Outpatient Prescriptions on File Prior to Visit  Medication Sig Dispense Refill  . amitriptyline (ELAVIL) 25 MG tablet TAKE ONE TABLET BY MOUTH NIGHTLY AT BEDTIME  30 tablet  2  . cholecalciferol (VITAMIN D) 1000 UNITS tablet Take 2,000 Units by mouth daily.       Marland Kitchen aspirin 81 MG tablet Take 81 mg by mouth daily.        Marland Kitchen b complex vitamins tablet Take 1 tablet by mouth daily.        Providence Lanius CAPS Take 1 capsule by mouth daily.        . Multiple Vitamin (MULTIVITAMIN) tablet Take 1 tablet by mouth daily.         No current facility-administered medications on file prior to visit.    BP 132/92  Pulse 79  Temp(Src) 98.6 F (37 C) (Oral)  Ht 5\' 9"  (1.753 m)  Wt 226 lb (102.513 kg)  BMI 33.36 kg/m2  SpO2 96%    Objective:   Physical Exam  Constitutional: He is oriented to person, place, and time. He appears well-developed and well-nourished. No distress.  HENT:  Head: Normocephalic and atraumatic.  Cardiovascular: Normal rate and regular rhythm.   No murmur heard. Pulmonary/Chest: Effort normal and breath sounds normal. No respiratory distress. He has no wheezes. He has no rales. He exhibits no tenderness.  Musculoskeletal: He exhibits no edema.  Right wrist is not swollen or tender.  Full rom of the right wrist.  Small ganglion cyst noted right dorsal hand.  Neurological: He is alert and oriented to person, place, and time.  Psychiatric: He has a normal mood and affect. His behavior is normal. Judgment and thought content normal.          Assessment & Plan:

## 2012-10-17 NOTE — Assessment & Plan Note (Signed)
Stable on elavil. Continue same.  

## 2012-10-17 NOTE — Assessment & Plan Note (Signed)
Most likely due to OA, though gout is a possibility.  Recommended trial of meloxicam.  Will obtain uric acid level.  If no improvement consider referral to orthopedics.

## 2012-10-17 NOTE — Patient Instructions (Addendum)
Please complete your lab work prior to leaving. Call if symptoms worsen or if not improved in 1-2 weeks.

## 2012-10-27 ENCOUNTER — Telehealth: Payer: Self-pay | Admitting: *Deleted

## 2012-10-27 DIAGNOSIS — M25531 Pain in right wrist: Secondary | ICD-10-CM

## 2012-10-27 NOTE — Telephone Encounter (Signed)
Received message from pt stating his right wrist pain is no better and may be a little worse.  Pt states we told him we would refer him if it did not get better. Pt is requesting referral.  Please advise.

## 2013-12-11 ENCOUNTER — Ambulatory Visit (INDEPENDENT_AMBULATORY_CARE_PROVIDER_SITE_OTHER): Payer: BC Managed Care – PPO | Admitting: Nurse Practitioner

## 2013-12-11 ENCOUNTER — Ambulatory Visit (HOSPITAL_BASED_OUTPATIENT_CLINIC_OR_DEPARTMENT_OTHER)
Admission: RE | Admit: 2013-12-11 | Discharge: 2013-12-11 | Disposition: A | Discharge status: Home / Self Care | Payer: BC Managed Care – PPO | Source: Ambulatory Visit | Attending: Nurse Practitioner | Admitting: Nurse Practitioner

## 2013-12-11 ENCOUNTER — Encounter: Payer: Self-pay | Admitting: Nurse Practitioner

## 2013-12-11 ENCOUNTER — Telehealth: Payer: Self-pay | Admitting: Nurse Practitioner

## 2013-12-11 VITALS — BP 142/92 | HR 67 | Temp 98.0°F | Ht 69.0 in | Wt 220.0 lb

## 2013-12-11 DIAGNOSIS — M542 Cervicalgia: Secondary | ICD-10-CM | POA: Insufficient documentation

## 2013-12-11 DIAGNOSIS — M791 Myalgia: Secondary | ICD-10-CM

## 2013-12-11 DIAGNOSIS — M5489 Other dorsalgia: Secondary | ICD-10-CM | POA: Insufficient documentation

## 2013-12-11 DIAGNOSIS — G8191 Hemiplegia, unspecified affecting right dominant side: Secondary | ICD-10-CM | POA: Insufficient documentation

## 2013-12-11 DIAGNOSIS — M7918 Myalgia, other site: Secondary | ICD-10-CM

## 2013-12-11 MED ORDER — KETOROLAC TROMETHAMINE 60 MG/2ML IM SOLN
60.0000 mg | Freq: Once | INTRAMUSCULAR | Status: AC
  Administered 2013-12-11: 60 mg via INTRAMUSCULAR

## 2013-12-11 MED ORDER — HYDROCODONE-ACETAMINOPHEN 5-325 MG PO TABS: 1.0000 | ORAL_TABLET | Freq: Four times a day (QID) | ORAL | Status: DC | PRN

## 2013-12-11 MED ORDER — METHOCARBAMOL 500 MG PO TABS: 1000.0000 mg | ORAL_TABLET | Freq: Three times a day (TID) | ORAL | Status: DC | PRN

## 2013-12-11 NOTE — Progress Notes (Signed)
Pre visit review using our clinic review tool, if applicable. No additional management support is needed unless otherwise documented below in the visit note. 

## 2013-12-11 NOTE — Telephone Encounter (Signed)
Patient notified of results. Patient stated that he could not tell if Toradol helping yet. Layne aware.

## 2013-12-11 NOTE — Patient Instructions (Addendum)
Stop naproxen, as it is likely causing gastritis. Start pepcid 20 mg twice daily for 1 week, then decrease to once daily for another week.  Take hydrocodone as needed for pain. It will make you drowsy.  Take muscle relaxer as needed.  Use heat 4-5 times daily.  Get xrays. I will call with results.

## 2013-12-11 NOTE — Telephone Encounter (Signed)
pls call pt: Advise xrays do not show fracture or malalignment.  Pain is likely all related to musculoskeletal injury. If it does not improve within 10 to 14 days, I will refer to orthopedist for further eval.  Ask if toradol helped.
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
## 2013-12-18 ENCOUNTER — Encounter: Payer: Self-pay | Admitting: Physician Assistant

## 2013-12-18 ENCOUNTER — Ambulatory Visit (INDEPENDENT_AMBULATORY_CARE_PROVIDER_SITE_OTHER): Payer: BC Managed Care – PPO | Admitting: Physician Assistant

## 2013-12-18 VITALS — BP 138/88 | HR 71 | Temp 98.2°F | Resp 16 | Ht 69.0 in | Wt 220.5 lb

## 2013-12-18 DIAGNOSIS — M62838 Other muscle spasm: Secondary | ICD-10-CM

## 2013-12-18 DIAGNOSIS — S161XXD Strain of muscle, fascia and tendon at neck level, subsequent encounter: Secondary | ICD-10-CM

## 2013-12-18 DIAGNOSIS — S161XXA Strain of muscle, fascia and tendon at neck level, initial encounter: Secondary | ICD-10-CM | POA: Insufficient documentation

## 2013-12-18 MED ORDER — CARISOPRODOL 350 MG PO TABS: 350.0000 mg | ORAL_TABLET | Freq: Three times a day (TID) | ORAL | Status: DC

## 2013-12-18 MED ORDER — HYDROCODONE-ACETAMINOPHEN 10-325 MG PO TABS
1.0000 | ORAL_TABLET | Freq: Three times a day (TID) | ORAL | Status: DC | PRN
Start: 2013-12-18 — End: 2013-12-25

## 2013-12-18 NOTE — Patient Instructions (Signed)
Please take medications as directed.  Only use Soma when at home and not operating vehicle or machinery.  Apply topical aspercreme to neck and shoulder blades.  Avoid heavy lifting or overexertion.  Symptoms will take a while to completely go away but should continue to improve.

## 2013-12-18 NOTE — Progress Notes (Signed)
Pre visit review using our clinic review tool, if applicable. No additional management support is needed unless otherwise documented below in the visit note/SLS  
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
## 2013-12-18 NOTE — Assessment & Plan Note (Signed)
Increased Norco to 10-325 mg.  Dc's Robaxin due to ineffectiveness.  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 Topical Aspercreme.  Rest.  Avoid heavy lifting or overexertion.  Follow-up 1 week if symptoms are not much improved.

## 2013-12-25 ENCOUNTER — Ambulatory Visit (INDEPENDENT_AMBULATORY_CARE_PROVIDER_SITE_OTHER): Payer: BC Managed Care – PPO | Admitting: Nurse Practitioner

## 2013-12-25 ENCOUNTER — Encounter: Payer: Self-pay | Admitting: Nurse Practitioner

## 2013-12-25 DIAGNOSIS — S161XXD Strain of muscle, fascia and tendon at neck level, subsequent encounter: Secondary | ICD-10-CM

## 2013-12-25 DIAGNOSIS — H9313 Tinnitus, bilateral: Secondary | ICD-10-CM

## 2013-12-25 NOTE — Progress Notes (Signed)
Pre visit review using our clinic review tool, if applicable. No additional management support is needed unless otherwise documented below in the visit note. 

## 2013-12-25 NOTE — Patient Instructions (Signed)
Continue with aspercream & ibuprophen as needed. Continue neck & shoulder stretches twice daily until you see physical therapy. Avoid heavy lifting until symptoms resolve-hopefully another 10 days or so.  See physical therapist.
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
## 2013-12-27 ENCOUNTER — Encounter: Payer: BC Managed Care – PPO | Admitting: Family Medicine

## 2013-12-27 NOTE — Progress Notes (Signed)
This encounter was created in error - please disregard.

## 2014-01-07 ENCOUNTER — Ambulatory Visit: Payer: BC Managed Care – PPO | Attending: Physical Therapy | Admitting: Physical Therapy

## 2014-01-07 DIAGNOSIS — Z5189 Encounter for other specified aftercare: Secondary | ICD-10-CM | POA: Insufficient documentation

## 2014-01-07 DIAGNOSIS — M542 Cervicalgia: Secondary | ICD-10-CM | POA: Diagnosis not present

## 2014-01-07 DIAGNOSIS — M25519 Pain in unspecified shoulder: Secondary | ICD-10-CM | POA: Diagnosis not present

## 2014-01-11 ENCOUNTER — Ambulatory Visit: Payer: BC Managed Care – PPO | Admitting: Physical Therapy

## 2014-01-11 DIAGNOSIS — Z5189 Encounter for other specified aftercare: Secondary | ICD-10-CM | POA: Diagnosis not present

## 2014-01-17 ENCOUNTER — Ambulatory Visit: Payer: BC Managed Care – PPO | Admitting: Physical Therapy

## 2014-01-17 DIAGNOSIS — Z5189 Encounter for other specified aftercare: Secondary | ICD-10-CM | POA: Diagnosis not present

## 2014-01-31 ENCOUNTER — Encounter: Payer: Self-pay | Admitting: Nurse Practitioner

## 2014-01-31 ENCOUNTER — Ambulatory Visit (INDEPENDENT_AMBULATORY_CARE_PROVIDER_SITE_OTHER): Payer: BC Managed Care – PPO | Admitting: Nurse Practitioner

## 2014-01-31 DIAGNOSIS — H9319 Tinnitus, unspecified ear: Secondary | ICD-10-CM

## 2014-01-31 DIAGNOSIS — H6590 Unspecified nonsuppurative otitis media, unspecified ear: Secondary | ICD-10-CM | POA: Insufficient documentation

## 2014-01-31 DIAGNOSIS — H6592 Unspecified nonsuppurative otitis media, left ear: Secondary | ICD-10-CM

## 2014-01-31 NOTE — Progress Notes (Signed)
Pre visit review using our clinic review tool, if applicable. No additional management support is needed unless otherwise documented below in the visit note. 

## 2014-01-31 NOTE — Patient Instructions (Signed)
It appears musculoskeletal injuries are healed. You may resume normal activities.  I am not clear what is causing ringing in ears. Tinnitis can be side effect of aspirin. Stop using it for 2 weeks. If no resolve, consider a neurology evaluation. If it stops, you may resume aspirin but at lower frequency.  You have clear fluid in middle ear, this is likely result of chronic allergies. Start daily sinus wash Lloyd Huger(Neil med sinus rinse). Use flonase after wash-1 spray twice daily. Use as instructed.  Let us know if we can help further.
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
## 2014-03-04 ENCOUNTER — Encounter: Payer: Self-pay | Admitting: Family

## 2014-03-04 DIAGNOSIS — J329 Chronic sinusitis, unspecified: Secondary | ICD-10-CM

## 2014-03-05 NOTE — Addendum Note (Signed)
Addended by: Sandford Craze'SULLIVAN, Kullen Tomasetti on: 03/05/2014 01:02 PM   Modules accepted: Orders

## 2015-03-24 ENCOUNTER — Encounter: Payer: Self-pay | Admitting: Gastroenterology

## 2015-04-25 ENCOUNTER — Ambulatory Visit (INDEPENDENT_AMBULATORY_CARE_PROVIDER_SITE_OTHER): Payer: BLUE CROSS/BLUE SHIELD | Admitting: Physician Assistant

## 2015-04-25 ENCOUNTER — Encounter: Payer: Self-pay | Admitting: Physician Assistant

## 2015-04-25 VITALS — BP 122/78 | HR 90 | Temp 98.4°F | Ht 69.0 in | Wt 229.0 lb

## 2015-04-25 DIAGNOSIS — M79671 Pain in right foot: Secondary | ICD-10-CM | POA: Insufficient documentation

## 2015-04-25 MED ORDER — ETODOLAC 200 MG PO CAPS: 200.0000 mg | ORAL_CAPSULE | Freq: Two times a day (BID) | ORAL | Status: DC

## 2015-04-25 MED FILL — ETODOLAC 400 MG TABLET: 400 | 15 days supply | Qty: 15 | Fill #0

## 2015-04-25 NOTE — Progress Notes (Signed)
Pre visit review using our clinic review tool, if applicable. No additional management support is needed unless otherwise documented below in the visit note. 

## 2015-04-25 NOTE — Progress Notes (Signed)
    Patient presents to clinic today c/o pain on the lateral dorsal aspect of the R foot since Sunday/Monday. 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 Denies known trauma or injury. Denies redness, swelling, numbness or tingling. Pain is intermittent and worse with prolonged ambulation.   Past Medical History  Diagnosis Date  . Routine general medical examination at a health care facility     Current Outpatient Prescriptions on File Prior to Visit  Medication Sig Dispense Refill  . aspirin 81 MG tablet Take 81 mg by mouth daily.    . cholecalciferol (VITAMIN D) 1000 UNITS tablet Take 2,000 Units by mouth daily.     . Multiple Vitamin (MULTIVITAMIN) tablet Take 1 tablet by mouth daily.     No current facility-administered medications on file prior to visit.    Allergies  Allergen Reactions  . Bupropion Hcl     REACTION: Hives    Family History  Problem Relation Age of Onset  . Cancer Mother     breast  . Breast cancer Mother   . Diabetes Brother     type II  . Cancer Other     lung    Social History   Social History  . Marital Status: Married    Spouse Name: N/A  . Number of Children: 1  . Years of Education: N/A   Occupational History  . 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 Smoking status: Former Games developer  . Smokeless tobacco: Never Used  . Alcohol Use: 5.0 oz/week    10 drink(s) per week  . Drug Use: No  . Sexual Activity: Not Asked   Other Topics Concern  . None   Social History Narrative   1 biological child, 2 step children         Review of Systems - See HPI.  All other ROS are negative.  BP 122/78 mmHg  Pulse 90  Temp(Src) 98.4 F (36.9 C) (Oral)  Ht  (1.753 m)  Wt 229 lb (103.874 kg)  BMI 33.80 kg/m2  SpO2 98%  Physical Exam  Constitutional: He is oriented to person, place, and time and well-developed, well-nourished, and in no distress.  HENT:  Head: Normocephalic and  atraumatic.  Eyes: Conjunctivae are normal.  Cardiovascular: Normal rate, regular rhythm, normal heart sounds and intact distal pulses.   Pulmonary/Chest: Effort normal and breath sounds normal. No respiratory distress. He has no wheezes. He has no rales. He exhibits no tenderness.  Musculoskeletal:       Left foot: Normal.       Feet:  Neurological: He is alert and oriented to person, place, and time.  Skin: Skin is warm and dry. No rash noted.  Psychiatric: Affect normal.  Vitals reviewed.  Assessment/Plan: Right foot pain Rx Lodine. ACE wrapping reviewed. Supportive footwear encouraged. RICE reviewed. Follow-up if not improving over the next 3-4 days as imaging will be warranted.

## 2015-04-25 NOTE — Patient Instructions (Signed)
Please elevate the foot while resting. Apply ice and ace wrap to the area of concern.  Take the Lodine twice daily as directed with food over the next 4-5 days. If symptoms are no improving over the weekend, please call me as I would like to get an x-ray at that point.

## 2015-04-27 NOTE — Assessment & Plan Note (Signed)
Rx Lodine. ACE wrapping reviewed. Supportive footwear encouraged. RICE reviewed. Follow-up if not improving over the next 3-4 days as imaging will be warranted.

## 2015-05-30 ENCOUNTER — Encounter: Payer: Self-pay | Admitting: Family Medicine

## 2015-05-30 ENCOUNTER — Ambulatory Visit (INDEPENDENT_AMBULATORY_CARE_PROVIDER_SITE_OTHER): Payer: BLUE CROSS/BLUE SHIELD | Admitting: Family Medicine

## 2015-05-30 VITALS — BP 146/78 | HR 85 | Temp 99.0°F | Ht 69.0 in | Wt 229.0 lb

## 2015-05-30 DIAGNOSIS — S60459A Superficial foreign body of unspecified finger, initial encounter: Secondary | ICD-10-CM

## 2015-05-30 NOTE — Progress Notes (Signed)
Pre visit review using our clinic review tool, if applicable. No additional management support is needed unless otherwise documented below in the visit note. 

## 2015-05-30 NOTE — Progress Notes (Signed)
   Subjective:    Patient ID: Adam Stewart, male    DOB: 1960/10/12, 55 y.o.   MRN: [id number]  HPI Here to help remove a broken splinter of fiberoptic cable that went into his finger this morning. It is mildly tender now.    Review of Systems  Constitutional: Negative.   Skin: Positive for wound.       Objective:   Physical Exam  Constitutional: He appears well-developed and well-nourished.  Skin:  The left index finger has a small sliver of material embedded in it          Assessment & Plan:  The cable sliver was removed with forceps. Dressed with Neosporin and a Bandaid.  Nelwyn SalisburyFRY,Tarance Balan A, MD

## 2015-06-02 ENCOUNTER — Encounter: Payer: Self-pay | Admitting: Family Medicine

## 2015-08-20 ENCOUNTER — Ambulatory Visit
Admission: RE | Admit: 2015-08-20 | Discharge: 2015-08-20 | Disposition: A | Discharge status: Home / Self Care | Payer: BLUE CROSS/BLUE SHIELD | Source: Ambulatory Visit | Attending: Family Medicine | Admitting: Family Medicine

## 2015-08-20 ENCOUNTER — Other Ambulatory Visit: Payer: Self-pay | Admitting: Family Medicine

## 2015-08-20 DIAGNOSIS — M25551 Pain in right hip: Secondary | ICD-10-CM

## 2015-08-20 DIAGNOSIS — M1611 Unilateral primary osteoarthritis, right hip: Secondary | ICD-10-CM | POA: Diagnosis not present

## 2016-08-26 ENCOUNTER — Encounter (HOSPITAL_COMMUNITY): Payer: Self-pay | Admitting: Emergency Medicine

## 2016-08-26 ENCOUNTER — Emergency Department (HOSPITAL_COMMUNITY)
Admission: EM | Admit: 2016-08-26 | Discharge: 2016-08-26 | Disposition: A | Discharge status: Home / Self Care | Payer: BLUE CROSS/BLUE SHIELD | Attending: Emergency Medicine | Admitting: Emergency Medicine

## 2016-08-26 DIAGNOSIS — Z79899 Other long term (current) drug therapy: Secondary | ICD-10-CM | POA: Insufficient documentation

## 2016-08-26 DIAGNOSIS — I10 Essential (primary) hypertension: Secondary | ICD-10-CM | POA: Diagnosis not present

## 2016-08-26 DIAGNOSIS — R42 Dizziness and giddiness: Secondary | ICD-10-CM | POA: Diagnosis not present

## 2016-08-26 DIAGNOSIS — Z87891 Personal history of nicotine dependence: Secondary | ICD-10-CM | POA: Insufficient documentation

## 2016-08-26 DIAGNOSIS — Z7982 Long term (current) use of aspirin: Secondary | ICD-10-CM | POA: Diagnosis not present

## 2016-08-26 DIAGNOSIS — R404 Transient alteration of awareness: Secondary | ICD-10-CM | POA: Diagnosis not present

## 2016-08-26 HISTORY — DX: Anxiety disorder, unspecified: F41.9

## 2016-08-26 LAB — CBC
HCT: 43.9 % (ref 39.0–52.0)
Hemoglobin: 15.5 g/dL (ref 13.0–17.0)
MCH: 31.8 pg (ref 26.0–34.0)
MCHC: 35.3 g/dL (ref 30.0–36.0)
MCV: 90 fL (ref 78.0–100.0)
Platelets: 228 10*3/uL (ref 150–400)
RBC: 4.88 MIL/uL (ref 4.22–5.81)
RDW: 12.5 % (ref 11.5–15.5)
WBC: 8.4 10*3/uL (ref 4.0–10.5)

## 2016-08-26 LAB — URINALYSIS, ROUTINE W REFLEX MICROSCOPIC
BILIRUBIN URINE: NEGATIVE
Glucose, UA: NEGATIVE mg/dL
Hgb urine dipstick: NEGATIVE
KETONES UR: NEGATIVE mg/dL
Leukocytes, UA: NEGATIVE
NITRITE: NEGATIVE
Protein, ur: NEGATIVE mg/dL
Specific Gravity, Urine: 1.018 (ref 1.005–1.030)
pH: 5 (ref 5.0–8.0)

## 2016-08-26 LAB — I-STAT TROPONIN, ED: TROPONIN I, POC: 0.01 ng/mL (ref 0.00–0.08)

## 2016-08-26 LAB — BASIC METABOLIC PANEL
Anion gap: 10 (ref 5–15)
BUN: 20 mg/dL (ref 6–20)
CALCIUM: 9.2 mg/dL (ref 8.9–10.3)
CO2: 20 mmol/L — ABNORMAL LOW (ref 22–32)
Chloride: 107 mmol/L (ref 101–111)
Creatinine, Ser: 0.83 mg/dL (ref 0.61–1.24)
GFR calc Af Amer: >60 mL/min (ref >60)
GFR calc non Af Amer: >60 mL/min (ref >60)
GLUCOSE: 123 mg/dL — AB (ref 65–99)
Potassium: 3.8 mmol/L (ref 3.5–5.1)
Sodium: 137 mmol/L (ref 135–145)

## 2016-08-26 LAB — CBG MONITORING, ED: GLUCOSE-CAPILLARY: 135 mg/dL — AB (ref 65–99)

## 2016-08-26 MED ORDER — MECLIZINE HCL 25 MG PO TABS: 25.0000 mg | ORAL_TABLET | Freq: Three times a day (TID) | ORAL | 0 refills | Status: DC | PRN

## 2016-08-26 MED ORDER — SODIUM CHLORIDE 0.9 % IV BOLUS (SEPSIS)
1000.0000 mL | Freq: Once | INTRAVENOUS | Status: AC
  Administered 2016-08-26: 1000 mL via INTRAVENOUS

## 2016-08-26 MED ORDER — MECLIZINE HCL 25 MG PO TABS
25.0000 mg | ORAL_TABLET | Freq: Once | ORAL | Status: AC
  Administered 2016-08-26: 25 mg via ORAL
  Filled 2016-08-26: qty 1

## 2016-08-26 MED ORDER — DIAZEPAM 5 MG PO TABS: 5.0000 mg | ORAL_TABLET | Freq: Three times a day (TID) | ORAL | 0 refills | Status: DC | PRN

## 2016-08-26 MED ORDER — LORAZEPAM 2 MG/ML IJ SOLN
1.0000 mg | Freq: Once | INTRAMUSCULAR | Status: AC
  Administered 2016-08-26: 1 mg via INTRAVENOUS
  Filled 2016-08-26: qty 1

## 2016-08-26 MED ORDER — DIAZEPAM 5 MG/ML IJ SOLN
5.0000 mg | Freq: Once | INTRAMUSCULAR | Status: AC
  Administered 2016-08-26: 5 mg via INTRAVENOUS
  Filled 2016-08-26: qty 2

## 2016-08-26 NOTE — ED Notes (Signed)
PT states he is feeling much better.

## 2016-08-26 NOTE — Discharge Instructions (Signed)
Meclizine for dizziness. Valium for severe dizziness. Follow up with ear, nose, throat specialists. Return if worsening.

## 2016-08-26 NOTE — ED Provider Notes (Signed)
[facility]-EMERGENCY DEPT Provider Note   CSN: [id number] Arrival date & time: 08/26/16  1023     History   Chief Complaint Chief Complaint  Patient presents with  . Dizziness  . Vomiting    HPI Adam Stewart is a 56 y.o. male.  HPI Adam Stewart is a 56 y.o. male with no major medical problems, presents to emergency department complaining of dizziness. Patient states that his dizziness started yesterday afternoon. Initially intermittent, however today he has gotten significantly worse and become persistent. Patient reports sensation of surrounding spinning. States worse with position changes and opening his eyes. Reports associated diaphoresis, nausea, vomiting. States feels like his head is in the fog. No headache. No ear pain or ringing in his ears. No chest pain, no SOB, no abdominal pain. No other complaints at this time. No hx of the same. Denies medical problems. Does not take medications daily. Was taken off lexapro, tapered down two weeks ago. Took one tab today to see if that was the cause, didn't help.   Past Medical History:  Diagnosis Date  . Anxiety   . Routine general medical examination at a health care facility     Patient Active Problem List   Diagnosis Date Noted  . Right foot pain 04/25/2015  . Tinnitus 01/31/2014  . Middle ear effusion 01/31/2014  . Cervical muscle strain 12/18/2013  . Pain in right wrist 10/17/2012  . MIGRAINE HEADACHE 05/22/2007  . HYPERTENSION, BORDERLINE 05/22/2007  . EXTERNAL HEMORRHOIDS 05/22/2007  . INSOMNIA 05/22/2007    Past Surgical History:  Procedure Laterality Date  . KNEE ARTHROSCOPY  2011   right       Home Medications    Prior to Admission medications   Medication Sig Start Date End Date Taking? Authorizing Provider  aspirin 81 MG tablet Take 81 mg by mouth daily.    [provider]  cholecalciferol (VITAMIN D) 1000 UNITS tablet Take 2,000 Units by mouth daily.     [provider]  etodolac  (LODINE) 200 MG capsule Take 1 capsule (200 mg total) by mouth 2 (two) times daily. Patient not taking: Reported on 05/30/2015 04/25/15   Waldon MerlMartin, William C, PA-C  hydrOXYzine (ATARAX/VISTARIL) 10 MG tablet Take 10 mg by mouth 2 (two) times daily as needed.    [provider]  mirtazapine (REMERON) 30 MG tablet Take 30 mg by mouth at bedtime. Reported on 05/30/2015    [provider]  Multiple Vitamin (MULTIVITAMIN) tablet Take 1 tablet by mouth daily.    [provider]  verapamil (CALAN) 120 MG tablet Take 120 mg by mouth daily.    [provider]    Family History Family History  Problem Relation Age of Onset  . Cancer Mother        breast  . Breast cancer Mother   . Diabetes Brother        type II  . Cancer Other        lung    Social History Social History  Substance Use Topics  . Smoking status: Former Games developermoker  . Smokeless tobacco: Never Used  . Alcohol use 6.0 oz/week    10 Standard drinks or equivalent per week     Allergies   Bupropion hcl   Review of Systems Review of Systems  Constitutional: Negative for chills and fever.  HENT: Negative for congestion, ear pain and hearing loss.   Respiratory: Negative for cough, chest tightness and shortness of breath.   Cardiovascular:  Negative for chest pain, palpitations and leg swelling.  Gastrointestinal: Negative for abdominal distention, abdominal pain, diarrhea, nausea and vomiting.  Genitourinary: Negative for dysuria, frequency, hematuria and urgency.  Musculoskeletal: Negative for arthralgias, myalgias, neck pain and neck stiffness.  Skin: Negative for rash.  Allergic/Immunologic: Negative for immunocompromised state.  Neurological: Positive for dizziness and light-headedness. Negative for syncope, speech difficulty, weakness, numbness and headaches.  All other systems reviewed and are negative.    Physical Exam Updated Vital Signs BP 132/90 (BP Location: Right Arm)   Pulse 64    Temp 97.3 F (36.3 C) (Oral)   Resp (!) 25   Ht 5\' 9"  (1.753 m)   Wt 99.8 kg (220 lb)   SpO2 100%   BMI 32.49 kg/m   Physical Exam  Constitutional: He is oriented to person, place, and time. He appears well-developed and well-nourished. No distress.  Pt unable to open eyes due to dizziness  HENT:  Head: Normocephalic and atraumatic.  Eyes: Conjunctivae and EOM are normal. Pupils are equal, round, and reactive to light.  Horizontal nystagmus  Neck: Neck supple.  Cardiovascular: Normal rate, regular rhythm and normal heart sounds.   Pulmonary/Chest: Effort normal. No respiratory distress. He has no wheezes. He has no rales.  Abdominal: Soft. Bowel sounds are normal. He exhibits no distension. There is no tenderness. There is no rebound.  Musculoskeletal: He exhibits no edema.  Neurological: He is alert and oriented to person, place, and time. He displays normal reflexes. No cranial nerve deficit. Coordination normal.  5/5 and equal upper and lower extremity strength bilaterally. Equal grip strength bilaterally. Normal finger to nose and heel to shin. No pronator drift.   Skin: Skin is warm and dry.  Nursing note and vitals reviewed.    ED Treatments / Results  Labs (all labs ordered are listed, but only abnormal results are displayed) Labs Reviewed  BASIC METABOLIC PANEL - Abnormal; Notable for the following:       Result Value   CO2 20 (*)    Glucose, Bld 123 (*)    All other components within normal limits  CBG MONITORING, ED - Abnormal; Notable for the following:    Glucose-Capillary 135 (*)    All other components within normal limits  CBC  URINALYSIS, ROUTINE W REFLEX MICROSCOPIC  I-STAT TROPOININ, ED    EKG  EKG Interpretation  Date/Time:  Thursday August 26 2016 10:28:35 EDT Ventricular Rate:  66 PR Interval:    QRS Duration: 105 QT Interval:  436 QTC Calculation: 457 R Axis:   72 Text Interpretation:  Sinus rhythm RSR' in V1 or V2, right VCD or RVH No old  tracing to compare Confirmed by Jerelyn Scott [phone]) on 08/26/2016 11:20:39 AM       Radiology No results found.  Procedures Procedures (including critical care time)  Medications Ordered in ED Medications  meclizine (ANTIVERT) tablet 25 mg (not administered)  diazepam (VALIUM) injection 5 mg (not administered)  sodium chloride 0.9 % bolus 1,000 mL (not administered)     Initial Impression / Assessment and Plan / ED Course  I have reviewed the triage vital signs and the nursing notes.  Pertinent labs & imaging results that were available during my care of the patient were reviewed by me and considered in my medical decision making (see chart for details).     Pt with dizziness, symptoms most consistent with vertigo. Will check labs,start benzos, IV fluids, meclizine, zofran. Will monitor.   1:00PM Patient initially had  some improvement with Valium, however continued to have spinning sensation. He is receiving IV fluids. I attempted Epley's maneuver, with good success. Patient stated he felt much much better. He was able to ambulate with just some dizziness, but no ataxia or sensation of room spinning. I question whether dizziness could be coming from Valium. He states he just feels drowsy. Plan to discharge home with meclizine. Patient has no major risk factors for CVA. Given improvement of his symptoms, doubt this is central cause of vertigo. He is stable for discharge.   2:26 PM  Apparently patient tried to get up again and dizziness has returned. I have given him Ativan which helped again. At this point in time he reports slight dizziness, however it is again much better. He would like to go home. I will give a prescription for meclizine in addition few tablets of Valium. He will go home, rest, hydrate. He will follow-up with ear nose throat. Return precautions strictly discussed. Patient agreed.  Vitals:   08/26/16 1145 08/26/16 1230 08/26/16 1330 08/26/16 1415  BP: 140/88  (!) 138/94 (!) 158/92 (!) 156/88  Pulse: 68 77 75 78  Resp: (!) 26 (!) 25 20 (!) 24  Temp:      TempSrc:      SpO2: 100% 95% 100% 100%  Weight:      Height:          Final Clinical Impressions(s) / ED Diagnoses   Final diagnoses:  Vertigo    New Prescriptions New Prescriptions   DIAZEPAM (VALIUM) 5 MG TABLET    Take 1 tablet (5 mg total) by mouth every 8 (eight) hours as needed (vertigo).   MECLIZINE (ANTIVERT) 25 MG TABLET    Take 1 tablet (25 mg total) by mouth 3 (three) times daily as needed for dizziness.     Jaynie Crumble, PA-C 08/26/16 1431    Jerelyn Scott, MD 08/26/16 1456

## 2016-08-26 NOTE — ED Notes (Signed)
Pt states he stopped his Lexapro two weeks ago. Pt states he tapered it by taking every other day and then just stopped it. Pt took a lexapro today because he was concerned that his symptoms that started yesterday were related to stopping this medication.

## 2016-08-26 NOTE — ED Notes (Signed)
Ambulated pt- pt tolerated fairly well. Pt denies dizziness. Pt stated he felt nauseous while walking and occasionally off balance.

## 2016-08-26 NOTE — ED Triage Notes (Signed)
Pt started feeling dizzy yesterday afternoon. This morning, went to work- started feeling more dizzy, vomiting, lowered himself to floor and was very diaphoretic. EMS arrived. States he almost passed out when he stood up. BP laying 140/80, sitting 127/76, CBG 138. Denies any pain. Pt states, "I can't open my eyes I just get really dizzy." EMS gave 4mg  zofran and 200mL NS. HR 70's. Pt remains diaphoretic

## 2016-08-30 ENCOUNTER — Ambulatory Visit (INDEPENDENT_AMBULATORY_CARE_PROVIDER_SITE_OTHER): Payer: BLUE CROSS/BLUE SHIELD | Admitting: Family Medicine

## 2016-08-30 ENCOUNTER — Encounter: Payer: Self-pay | Admitting: Family Medicine

## 2016-08-30 ENCOUNTER — Encounter: Payer: Self-pay | Admitting: Family

## 2016-08-30 VITALS — BP 130/80 | HR 83 | Temp 98.4°F | Ht 69.0 in | Wt 222.2 lb

## 2016-08-30 DIAGNOSIS — H8111 Benign paroxysmal vertigo, right ear: Secondary | ICD-10-CM

## 2016-08-30 MED ORDER — ONDANSETRON HCL 4 MG PO TABS: 4.0000 mg | ORAL_TABLET | Freq: Three times a day (TID) | ORAL | 0 refills | Status: DC | PRN

## 2016-08-30 MED ORDER — MECLIZINE HCL 25 MG PO TABS: 25.0000 mg | ORAL_TABLET | Freq: Three times a day (TID) | ORAL | 0 refills | Status: DC | PRN

## 2016-08-30 MED FILL — ONDANSETRON HCL 4 MG TABLET: 4 | 7 days supply | Qty: 20 | Fill #0

## 2016-08-30 NOTE — Telephone Encounter (Signed)
Lets bring him in for an OV please.

## 2016-08-30 NOTE — Progress Notes (Signed)
Chief Complaint  Patient presents with  . Dizziness    Adam Stewart is 56 y.o. pt here for dizziness.  Duration: 4 days Pass out? No Spinning? Yes Recent illness/fever? No Headache? No Neurologic signs? No- Specifically denies vision changes, difficulty with speech, trouble swallowing, weakness, numbness, tingling, or head injury. Change in PO intake? No  Went to ED, slightly better since then. It is not constant, comes with movement towards the R side more.  ROS:  Neuro: As noted in HPI Eyes: No vision changes  Past Medical History:  Diagnosis Date  . Anxiety     Family History  Problem Relation Age of Onset  . Cancer Mother        breast  . Breast cancer Mother   . Diabetes Brother        type II  . Cancer Other        lung    Allergies as of 08/30/2016      Reactions   Bupropion Hcl Hives      Medication List       Accurate as of 08/30/16  3:01 PM. Always use your most recent med list.          escitalopram 10 MG tablet Commonly known as:  LEXAPRO Take 10 mg by mouth daily.   etodolac 200 MG capsule Commonly known as:  LODINE Take 1 capsule (200 mg total) by mouth 2 (two) times daily.   ibuprofen 200 MG tablet Commonly known as:  ADVIL,MOTRIN Take 200 mg by mouth every 6 (six) hours as needed for moderate pain.   meclizine 25 MG tablet Commonly known as:  ANTIVERT Take 1 tablet (25 mg total) by mouth 3 (three) times daily as needed for dizziness.   ondansetron 4 MG tablet Commonly known as:  ZOFRAN Take 1 tablet (4 mg total) by mouth every 8 (eight) hours as needed for nausea or vomiting.       BP 130/80 (BP Location: Left Arm, Patient Position: Sitting, Cuff Size: Large)   Pulse 83   Temp 98.4 F (36.9 C) (Oral)   Ht 5\' 9"  (1.753 m)   Wt 222 lb 4 oz (100.8 kg)   SpO2 96%   BMI 32.82 kg/m  General: Awake, alert, appears stated age Eyes: PERRLA, EOMi Ears: Patent, TM's neg b/l Heart: RRR, no murmurs, no carotid bruits Lungs: CTAB,  no accessory muscle use MSK: 5/5 strength throughout, gait normal Neuro: No cerebellar signs, patellar reflex 2/4 b/l wo clonus, calcaneal reflex 1/4 b/l wo clonus, biceps reflex 1/4 b/l wo clonus; Dix-Hall-Pike positive on the R. Psych: Age appropriate judgment and insight, normal mood and affect  Benign paroxysmal positional vertigo of right ear - Plan: ondansetron (ZOFRAN) 4 MG tablet, meclizine (ANTIVERT) 25 MG tablet, Ambulatory referral to Physical Therapy  Orders as above. Cont home Epley maneuvers. Refer to vest rehab.  No red flag signs. F/u prn. Pt voiced understanding and agreement to the plan.  Jilda Rocheicholas Paul LowellvilleWendling, DO 08/30/16 3:01 PM

## 2016-08-30 NOTE — Patient Instructions (Addendum)
Keep trying the Epley maneuvers at home.   Cancel appointment with vestibular rehab if you are doing better.

## 2016-08-30 NOTE — Telephone Encounter (Signed)
It looks like pt has scheduled an appointment today with Dr Carmelia RollerWendling at 1:30pm. Message sent to pt to confirm.

## 2016-09-03 ENCOUNTER — Ambulatory Visit (INDEPENDENT_AMBULATORY_CARE_PROVIDER_SITE_OTHER): Payer: BLUE CROSS/BLUE SHIELD | Admitting: Physical Therapy

## 2016-09-03 DIAGNOSIS — R42 Dizziness and giddiness: Secondary | ICD-10-CM | POA: Diagnosis not present

## 2016-09-03 DIAGNOSIS — R2689 Other abnormalities of gait and mobility: Secondary | ICD-10-CM

## 2016-09-03 DIAGNOSIS — R2681 Unsteadiness on feet: Secondary | ICD-10-CM

## 2016-09-03 NOTE — Patient Instructions (Signed)
Gaze Stabilization: Sitting    Keeping eyes on target on wall 3-5 feet away, and move head side to side for _30-60___ seconds. Repeat while moving head up and down for __30-60__ seconds. Do __2-3__ sessions per day.  Add a busy background for an added challenge. Progress to standing as you are able to maintain your gaze on the letter.     Gaze Stabilization: Tip Card  1.Target must remain in focus, not blurry, and appear stationary while head is in motion. 2.Perform exercises with small head movements (45 to either side of midline). 3.Increase speed of head motion so long as target is in focus. 4.If you wear eyeglasses, be sure you can see target through lens (therapist will give specific instructions for bifocal / progressive lenses). 5.These exercises may provoke dizziness or nausea. Work through these symptoms. If too dizzy, slow head movement slightly. Rest between each exercise. 6.Exercises demand concentration; avoid distractions.   Compensatory Strategies: Corrective Saccades    1. Holding two stationary targets placed __8-10__ inches apart, move eyes to target, keep head still. 2. Then move head in direction of target while eyes remain on target. 3/4. Repeat in opposite direction. Perform sitting. Repeat sequence _10___ times per session. Do __2-3__ sessions per day.

## 2016-09-03 NOTE — Therapy (Signed)
[facility] [address] [place], [place], [address] Phone: [phone]   Fax:  [phone]  Physical Therapy Evaluation  Patient Details  Name: Adam Stewart MRN: [id number] Date of Birth: 1960-06-26 Referring Provider: Dr. Rosemarie AxWendling/Melissa O'Sullivan, PA-C  Encounter Date: 09/03/2016      PT End of Session - 09/03/16 1025    Visit Number 1   Number of Visits 6   Date for PT Re-Evaluation 10/15/16   Authorization Type BCBS - $50 copay   PT Start Time 0930   PT Stop Time 1010   PT Time Calculation (min) 40 min   Activity Tolerance Patient tolerated treatment well   Behavior During Therapy [facility] for tasks assessed/performed      Past Medical History:  Diagnosis Date  . Anxiety     Past Surgical History:  Procedure Laterality Date  . KNEE ARTHROSCOPY  2011   right    There were no vitals filed for this visit.       Subjective Assessment - 09/03/16 0932    Subjective Pt is a 56 y/o male who presents to OPPT for sudden onset of vertigo which began last week on 08/26/16.  Pt reports dizziness has persisted and is still having "a fog" and can't seem to resolve the symptoms.  Pt also reports stopping Lexapro prior to onset.  Pt also c/o onset of ear fullness and ringing in the ears and can feel a "heartbeat" in the ears.   Limitations Standing;Walking;Sitting   Patient Stated Goals improve symptoms   Currently in Pain? No/denies            [facility] - 09/03/16 [id number]      Assessment   Medical Diagnosis vertigo   Referring Provider Dr. Rosemarie AxWendling/Melissa O'Sullivan, PA-C   Onset Date/Surgical Date 08/26/16   Next MD Visit PRN   Prior Therapy none     Precautions   Precautions None     Restrictions   Weight Bearing Restrictions No     Balance Screen   Has the patient fallen in the past 6 months No   Has the patient had a decrease in activity level because of a fear of falling?  Yes   Is the patient  reluctant to leave their home because of a fear of falling?  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   Living Environment Private residence   Living Arrangements Spouse/significant other   Type of Home House   Home Access Stairs to enter   Entrance Stairs-Number of Steps 2   Entrance Stairs-Rails Right   Home Layout One level   Home Equipment None     Prior Function   Level of Independence Independent   Vocation Full time employment   Vocation Requirements IT-very active position   Leisure brew beer,  mountain biking     Cognition   Overall Cognitive Status Within Functional Limits for tasks assessed     Ambulation/Gait   Ambulation/Gait Yes   Ambulation/Gait Assistance 5: Supervision   Ambulation/Gait Assistance Details pt occasionally reaching for UE support; no significant lateral deviations noted   Ambulation Distance (Feet) 100 Feet   Gait Pattern Wide base of support            Vestibular Assessment - 09/03/16 0940      Vestibular Assessment   General Observation no dizziness, c/o being in a "cloud"     Symptom Behavior   Type of Dizziness "World moves"  fog, lateral movements,  initially spinning   Frequency of Dizziness constant except with lying down; triggered by movement   Duration of Dizziness for duration of movement   Aggravating Factors Activity in general   Relieving Factors Lying supine;Dark room;Rest;Closing eyes     Occulomotor Exam   Occulomotor Alignment Normal   Spontaneous Absent   Gaze-induced Absent   Smooth Pursuits Intact   Saccades Poor trajectory;Dysmetria  undershooting to Rt   Comment c/o constant double vision     Vestibulo-Occular Reflex   VOR 1 Head Only (x 1 viewing) difficulty maintaining gaze   Comment head thrust positive to Lt     Positional Testing   Dix-Hallpike Dix-Hallpike Right;Dix-Hallpike Left   Horizontal Canal Testing Horizontal Canal Right;Horizontal Canal Left     Dix-Hallpike Right   Dix-Hallpike Right Duration  sustained Rt horizontal   Dix-Hallpike Right Symptoms Right nystagmus     Dix-Hallpike Left   Dix-Hallpike Left Duration sustained Rt horizontal   Dix-Hallpike Left Symptoms Right nystagmus     Horizontal Canal Right   Horizontal Canal Right Duration none   Horizontal Canal Right Symptoms Normal     Horizontal Canal Left   Horizontal Canal Left Duration none   Horizontal Canal Left Symptoms Normal        Objective measurements completed on examination: See above findings.          [facility] Adult PT Treatment/Exercise - 09/03/16 0937      Self-Care   Self-Care Other Self-Care Comments   Other Self-Care Comments  educated on maintaining gaze on a target with positional changes, and repeated movements  (ex sit to stand) if pt has symptoms with transitional movements; discussed clinical findings and likely cause of symptoms         Vestibular Treatment/Exercise - 09/03/16 1015      Vestibular Treatment/Exercise   Vestibular Treatment Provided Gaze   Gaze Exercises X1 Viewing Horizontal;X1 Viewing Vertical;Eye/Head Exercise Horizontal     X1 Viewing Horizontal   Foot Position seated and standing with full field stimulus   Time --  30 sec   Reps 2   Comments difficulty maintaining gaze, increased difficulty with full field stimulus - performed to educated on progression     X1 Viewing Vertical   Foot Position seated   Time --  30 sec   Reps 1   Comments no difficulty     Eye/Head Exercise Horizontal   Foot Position seated   Reps 10   Comments corrective saccades               PT Education - 09/03/16 1025    Education provided Yes   Education Details HEP, vestibular neuritis, habituation techniques   Person(s) Educated Patient   Methods Explanation;Handout;Demonstration   Comprehension Verbalized understanding;Returned demonstration             PT Long Term Goals - 09/03/16 1030      PT LONG TERM GOAL #1   Title independent with HEP (10/15/16)    Time 6   Period Weeks   Status New     PT LONG TERM GOAL #2   Title perform FGA with appropriate goal to be written (10/15/16)   Time 6   Period Weeks   Status New     PT LONG TERM GOAL #3   Title report 75% improvement in symptoms (10/15/16)   Time 6   Period Weeks   Status New     PT LONG TERM GOAL #4   Title  amb > 200' on various indoor/outdoor surfaces independently without increase in symptoms (10/15/16)   Time 6   Period Weeks   Status New                Plan - 09/03/16 1026    Clinical Impression Statement Pt is a 56 y/o male who presents to OPPT for sudden onset of vertigo on 08/26/16.  Clinical findings most consistent with vestibular neuritis affecting Lt ear as head thrust test is positive to Lt and pt still with Rt horizontal nystagmus with positional changes.  Pt demonstrates difficulty with maintaining gaze with head and eye movements so initiated gaze exercises today.  Gait slightly abnormal with wide BOS and reaching for UE support, and will plan to formally assess next session.  Will benefit from PT to address deficits listed.   History and Personal Factors relevant to plan of care: n/a   Clinical Presentation Evolving   Clinical Presentation due to: severity of symptoms   Clinical Decision Making Low   Rehab Potential Good   PT Frequency 1x / week   PT Duration 6 weeks   PT Treatment/Interventions ADLs/Self Care Home Management;Canalith Repostioning;Neuromuscular re-education;Balance training;Therapeutic exercise;Therapeutic activities;Functional mobility training;Gait training;Stair training;Patient/family education;Vestibular   PT Next Visit Plan review HEP and progress PRN, FGA next session, progress habituation and dynamic gait/balance activities   Consulted and Agree with Plan of Care Patient      Patient will benefit from skilled therapeutic intervention in order to improve the following deficits and impairments:  Abnormal gait, Decreased balance,  Difficulty walking, Dizziness  Visit Diagnosis: Other abnormalities of gait and mobility - Plan: PT plan of care cert/re-cert  Dizziness and giddiness - Plan: PT plan of care cert/re-cert  Unsteadiness on feet - Plan: PT plan of care cert/re-cert     Problem List Patient Active Problem List   Diagnosis Date Noted  . Right foot pain 04/25/2015  . Tinnitus 01/31/2014  . Middle ear effusion 01/31/2014  . Cervical muscle strain 12/18/2013  . Pain in right wrist 10/17/2012  . MIGRAINE HEADACHE 05/22/2007  . HYPERTENSION, BORDERLINE 05/22/2007  . EXTERNAL HEMORRHOIDS 05/22/2007  . INSOMNIA 05/22/2007      Clarita Crane, PT, DPT 09/03/16 10:34 AM    [facility] [address] [place], [place], [address] Phone: [phone]   Fax:  [phone]  Name: Adam Stewart MRN: [id number] Date of Birth: 1961/02/22

## 2016-09-09 ENCOUNTER — Ambulatory Visit (INDEPENDENT_AMBULATORY_CARE_PROVIDER_SITE_OTHER): Payer: BLUE CROSS/BLUE SHIELD | Admitting: Physical Therapy

## 2016-09-09 DIAGNOSIS — R2681 Unsteadiness on feet: Secondary | ICD-10-CM

## 2016-09-09 DIAGNOSIS — R2689 Other abnormalities of gait and mobility: Secondary | ICD-10-CM

## 2016-09-09 DIAGNOSIS — R42 Dizziness and giddiness: Secondary | ICD-10-CM | POA: Diagnosis not present

## 2016-09-09 NOTE — Therapy (Signed)
[facility] [address] [place], [place], [address] Phone: [phone]   Fax:  [phone]  Physical Therapy Treatment  Patient Details  Name: Adam Stewart MRN: [id number] Date of Birth: Jun 06, 1960 Referring Provider: Dr. Rosemarie AxWendling/Melissa O'Sullivan, PA-C  Encounter Date: 09/09/2016      PT End of Session - 09/09/16 0846    Visit Number 2   Number of Visits 6   Date for PT Re-Evaluation 10/15/16   Authorization Type BCBS - $50 copay   PT Start Time 0758   PT Stop Time 0836   PT Time Calculation (min) 38 min   Activity Tolerance Patient tolerated treatment well   Behavior During Therapy [facility] for tasks assessed/performed      Past Medical History:  Diagnosis Date  . Anxiety     Past Surgical History:  Procedure Laterality Date  . KNEE ARTHROSCOPY  2011   right    There were no vitals filed for this visit.      Subjective Assessment - 09/09/16 0756    Subjective still feeling a little dizzy; was lightheaded this morning.  c/o being in a "fog" like "I'm on cold medicine."   Limitations Standing;Walking;Sitting   Patient Stated Goals improve symptoms   Currently in Pain? No/denies                          Vestibular Treatment/Exercise - 09/09/16 0809      Vestibular Treatment/Exercise   Vestibular Treatment Provided Gaze   Gaze Exercises X1 Viewing Horizontal     X1 Viewing Horizontal   Foot Position sitting, feet together and bouncing on physioball   Time --  30 sec   Reps 1  each position   Comments improved ability to maintain gaze-reinforced speed as long as target stays in focus     X1 Viewing Vertical   Foot Position bouncing on physioball   Time --  30 sec   Reps 1   Comments mild increase in difficulty     Eye/Head Exercise Horizontal   Foot Position seated   Reps 10   Comments corrective saccades; instructed in how to apply to daily activities            Balance  Exercises - 09/09/16 0809      Balance Exercises: Standing   Standing Eyes Opened Foam/compliant surface;Narrow base of support (BOS);Head turns   Standing Eyes Closed Foam/compliant surface;Narrow base of support (BOS);Wide (BOA);5 reps;10 secs  Also Head Turns with Wide BOS   Gait with Head Turns Forward  minguard A   Other Standing Exercises amb with gaze x 1 horizontal/vertical 40'x2 with min A; amb with EC with min A and significant Lt deviations-improved with VCs           PT Education - 09/09/16 0846    Education provided Yes   Education Details corner balance exercises   Person(s) Educated Patient   Methods Explanation;Demonstration;Handout   Comprehension Verbalized understanding;Returned demonstration             PT Long Term Goals - 09/03/16 1030      PT LONG TERM GOAL #1   Title independent with HEP (10/15/16)   Time 6   Period Weeks   Status New     PT LONG TERM GOAL #2   Title perform FGA with appropriate goal to be written (10/15/16)   Time 6   Period Weeks   Status New  PT LONG TERM GOAL #3   Title report 75% improvement in symptoms (10/15/16)   Time 6   Period Weeks   Status New     PT LONG TERM GOAL #4   Title amb > 200' on various indoor/outdoor surfaces independently without increase in symptoms (10/15/16)   Time 6   Period Weeks   Status New               Plan - 09/09/16 0846    Clinical Impression Statement Pt reports overall being able to increase activity and exercises are getting easier but continues to have symptoms after ~45 min at work with increased activity.  Significant difficulty with dynamic activities as well as compliant surface exercises today but improved with repetition.  Will continue to benefit from PT to maximize function.   PT Treatment/Interventions ADLs/Self Care Home Management;Canalith Repostioning;Neuromuscular re-education;Balance training;Therapeutic exercise;Therapeutic activities;Functional mobility  training;Gait training;Stair training;Patient/family education;Vestibular   PT Next Visit Plan review HEP and progress PRN, FGA next session, progress habituation and dynamic gait/balance activities   Consulted and Agree with Plan of Care Patient      Patient will benefit from skilled therapeutic intervention in order to improve the following deficits and impairments:  Abnormal gait, Decreased balance, Difficulty walking, Dizziness  Visit Diagnosis: Other abnormalities of gait and mobility  Dizziness and giddiness  Unsteadiness on feet     Problem List Patient Active Problem List   Diagnosis Date Noted  . Right foot pain 04/25/2015  . Tinnitus 01/31/2014  . Middle ear effusion 01/31/2014  . Cervical muscle strain 12/18/2013  . Pain in right wrist 10/17/2012  . MIGRAINE HEADACHE 05/22/2007  . HYPERTENSION, BORDERLINE 05/22/2007  . EXTERNAL HEMORRHOIDS 05/22/2007  . INSOMNIA 05/22/2007      Clarita Crane, PT, DPT 09/09/16 8:50 AM    [facility] [address] [place], [place], [address] Phone: [phone]   Fax:  [phone]  Name: Adam Stewart MRN: [id number] Date of Birth: 04-19-1960

## 2016-09-09 NOTE — Patient Instructions (Signed)
Feet Together (Compliant Surface) Varied Arm Positions - Eyes Closed    Stand on compliant surface: __pillow or cushion___ with feet together and arms at your side. Close eyes and try to stand still. Hold__10-15__ seconds. Repeat __5__ times per session. Do __2-3__ sessions per day.   Feet Together (Compliant Surface) Head Motion - Eyes Open    With eyes open, standing on compliant surface: __pillow or cushion__, feet together, move head slowly: up and down 10 times and side to side 10 times. Repeat _1-2___ times per session. Do __2-3__ sessions per day.   Feet Apart (Compliant Surface) Head Motion - Eyes Closed    Stand on compliant surface: _pillow or cushion__ with feet shoulder width apart. Close eyes and move head slowly, up and down 10 times and side to side 10 times. Repeat _1-2___ times per session. Do __2-3__ sessions per day.

## 2016-09-16 ENCOUNTER — Ambulatory Visit (INDEPENDENT_AMBULATORY_CARE_PROVIDER_SITE_OTHER): Payer: BLUE CROSS/BLUE SHIELD | Admitting: Physical Therapy

## 2016-09-16 DIAGNOSIS — R2689 Other abnormalities of gait and mobility: Secondary | ICD-10-CM | POA: Diagnosis not present

## 2016-09-16 DIAGNOSIS — R2681 Unsteadiness on feet: Secondary | ICD-10-CM

## 2016-09-16 DIAGNOSIS — R42 Dizziness and giddiness: Secondary | ICD-10-CM | POA: Diagnosis not present

## 2016-09-16 NOTE — Therapy (Signed)
[facility] [address] [place], [place], [address] Phone: [phone]   Fax:  [phone]  Physical Therapy Treatment  Patient Details  Name: Adam Stewart MRN: [id number] Date of Birth: April 17, 1960 Referring Provider: Dr. Rosemarie Ax, PA-C  Encounter Date: 09/16/2016      PT End of Session - 09/16/16 0826    Visit Number 3   Number of Visits 6   Date for PT Re-Evaluation 10/15/16   Authorization Type [employer] - $50 copay   PT Start Time 0750   PT Stop Time 0816   PT Time Calculation (min) 26 min   Activity Tolerance Patient tolerated treatment well   Behavior During Therapy [facility] for tasks assessed/performed      Past Medical History:  Diagnosis Date  . Anxiety     Past Surgical History:  Procedure Laterality Date  . KNEE ARTHROSCOPY  2011   right    There were no vitals filed for this visit.      Subjective Assessment - 09/16/16 0749    Subjective no more dizziness, but still feels like he's in a fog and off balance.  exercises increase symptoms and then c/o constant "fog."  yesterday felt constant "popping" in Lt ear only.   Limitations Standing;Walking;Sitting   Patient Stated Goals improve symptoms   Currently in Pain? No/denies                Vestibular Assessment - 09/16/16 0803      Vestibular Assessment   General Observation no dizziness, c/o being in a "cloud"     Orthostatics   BP supine (x 5 minutes) 129/86   HR supine (x 5 minutes) 62   BP standing (after 1 minute) 143/94   HR standing (after 1 minute) 69   BP standing (after 3 minutes) 136/91   HR standing (after 3 minutes) 72   Orthostatics Comment lightheadedness with standing initially                 [facility] Adult PT Treatment/Exercise - 09/16/16 0823      Self-Care   Other Self-Care Comments  session focued on discussion of current symptoms and complaints as well as assessment of orthostatic hypotension.   Testing negative despite min c/o lightheadedness with standing which resolved quickly.  Discussed how current exercises increase symptoms in the AM and then affects ability to perform work responsibilities.  Pt feels this may just be due to increased activity rather than specific exercises, but also report that exercises are still equally challenging and no improvements.  Due to c/o continued fullness in ear recommended the following: only do exercises in PM and low sodium diet (< 1500 mg) to see if he notices any changes.  If no improvement recommend pt follow up with specialist.                       PT Long Term Goals - 09/03/16 1030      PT LONG TERM GOAL #1   Title independent with HEP (10/15/16)   Time 6   Period Weeks   Status New     PT LONG TERM GOAL #2   Title perform FGA with appropriate goal to be written (10/15/16)   Time 6   Period Weeks   Status New     PT LONG TERM GOAL #3   Title report 75% improvement in symptoms (10/15/16)   Time 6   Period Weeks   Status  New     PT LONG TERM GOAL #4   Title amb > 200' on various indoor/outdoor surfaces independently without increase in symptoms (10/15/16)   Time 6   Period Weeks   Status New               Plan - 09/16/16 16100826    Clinical Impression Statement Pt states minimal improvement in current symptoms.  At this time he feels dizziness has resolved, but fullness and imbalance persists.  Recommended low sodium diet to see if symptoms improve, and to continue to be active and do HEP in PM but only push symptoms to 4-5/10 before stopping to rest.  Pt verbalized understanding.  If no improvement in symptoms recommend pt follow up with specialist.   PT Treatment/Interventions ADLs/Self Care Home Management;Canalith Repostioning;Neuromuscular re-education;Balance training;Therapeutic exercise;Therapeutic activities;Functional mobility training;Gait training;Stair training;Patient/family education;Vestibular   PT  Next Visit Plan review HEP and progress PRN, FGA next session, progress habituation and dynamic gait/balance activities   Consulted and Agree with Plan of Care Patient      Patient will benefit from skilled therapeutic intervention in order to improve the following deficits and impairments:  Abnormal gait, Decreased balance, Difficulty walking, Dizziness  Visit Diagnosis: Other abnormalities of gait and mobility  Dizziness and giddiness  Unsteadiness on feet     Problem List Patient Active Problem List   Diagnosis Date Noted  . Right foot pain 04/25/2015  . Tinnitus 01/31/2014  . Middle ear effusion 01/31/2014  . Cervical muscle strain 12/18/2013  . Pain in right wrist 10/17/2012  . MIGRAINE HEADACHE 05/22/2007  . HYPERTENSION, BORDERLINE 05/22/2007  . EXTERNAL HEMORRHOIDS 05/22/2007  . INSOMNIA 05/22/2007      Clarita CraneStephanie F Youssef Footman, PT, DPT 09/16/16 8:29 AM    [facility] [address] [place], [place], [address] Phone: [phone]   Fax:  [phone]  Name: Adam Stewart MRN: [id number] Date of Birth: 1960-11-05

## 2016-09-22 ENCOUNTER — Ambulatory Visit (INDEPENDENT_AMBULATORY_CARE_PROVIDER_SITE_OTHER): Payer: Self-pay | Admitting: Physical Therapy

## 2016-09-22 DIAGNOSIS — R2689 Other abnormalities of gait and mobility: Secondary | ICD-10-CM

## 2016-09-22 DIAGNOSIS — R2681 Unsteadiness on feet: Secondary | ICD-10-CM

## 2016-09-22 DIAGNOSIS — R42 Dizziness and giddiness: Secondary | ICD-10-CM

## 2016-09-22 NOTE — Therapy (Addendum)
[facility] [address] [place], [place], [address] Phone: [phone]   Fax:  [phone]  Physical Therapy Note  Patient Details  Name: Adam Stewart MRN: [id number] Date of Birth: [birth year] Referring Provider: Dr. Rosemarie Ax, PA-C  Encounter Date: 09/22/2016     Note: No charge for session as pt without any improvements and recommend referral to neurotology for additional testing.  Past Medical History:  Diagnosis Date  . Anxiety     Past Surgical History:  Procedure Laterality Date  . KNEE ARTHROSCOPY  2011   right    There were no vitals filed for this visit.      Subjective Assessment - 09/22/16 1516    Subjective nothing has changed.  still thinks it may be medication related - so started taking his antidepressant again.  Symptoms 0% improved.   Patient Stated Goals improve symptoms   Currently in Pain? No/denies                                 PT Education - 09/22/16 1523    Education provided Yes   Education Details neurotology specialist in the area   Person(s) Educated Patient   Methods Explanation;Handout   Comprehension Verbalized understanding             PT Long Term Goals - 09/03/16 1030      PT LONG TERM GOAL #1   Title independent with HEP (10/15/16)   Time 6   Period Weeks   Status New     PT LONG TERM GOAL #2   Title perform FGA with appropriate goal to be written (10/15/16)   Time 6   Period Weeks   Status New     PT LONG TERM GOAL #3   Title report 75% improvement in symptoms (10/15/16)   Time 6   Period Weeks   Status New     PT LONG TERM GOAL #4   Title amb > 200' on various indoor/outdoor surfaces independently without increase in symptoms (10/15/16)   Time 6   Period Weeks   Status New               Plan - 09/22/16 1523    Clinical Impression Statement At this time pt reports no change in symptoms since beginning PT  despite multiple treatment interventions.  At this time recommend hold from PT and pt to follow up with MD/specialist for additional work-up.   PT Treatment/Interventions ADLs/Self Care Home Management;Canalith Repostioning;Neuromuscular re-education;Balance training;Therapeutic exercise;Therapeutic activities;Functional mobility training;Gait training;Stair training;Patient/family education;Vestibular   PT Next Visit Plan hold PT   Consulted and Agree with Plan of Care Patient      Patient will benefit from skilled therapeutic intervention in order to improve the following deficits and impairments:  Abnormal gait, Decreased balance, Difficulty walking, Dizziness  Visit Diagnosis: Other abnormalities of gait and mobility  Dizziness and giddiness  Unsteadiness on feet     Problem List Patient Active Problem List   Diagnosis Date Noted  . Right foot pain 04/25/2015  . Tinnitus 01/31/2014  . Middle ear effusion 01/31/2014  . Cervical muscle strain 12/18/2013  . Pain in right wrist 10/17/2012  . MIGRAINE HEADACHE 05/22/2007  . HYPERTENSION, BORDERLINE 05/22/2007  . EXTERNAL HEMORRHOIDS 05/22/2007  . INSOMNIA 05/22/2007      Clarita Crane, PT, DPT 09/22/16 3:25 PM    [facility] [place]  [place], [place], [address] Phone: [phone]   Fax:  [phone]  Name: Abrahan Fulmore Lucy MRN: [id number] Date of Birth: 1960/06/07      PHYSICAL THERAPY DISCHARGE SUMMARY  Visits from Start of Care: 3  Current functional level related to goals / functional outcomes: See above   Remaining deficits: No improvement made per pt; recommended specialty follow up.   Education / Equipment: HEP  Plan: Patient agrees to discharge.  Patient goals were not met. Patient is being discharged due to lack of progress.  ?????     Laureen Abrahams, PT, DPT 11/26/16 9:39 AM   [place], [place], [address] Phone: [phone]  Fax: [phone]

## 2016-10-04 ENCOUNTER — Encounter: Payer: Self-pay | Admitting: Family Medicine

## 2016-10-04 DIAGNOSIS — R42 Dizziness and giddiness: Secondary | ICD-10-CM

## 2016-10-04 NOTE — Telephone Encounter (Signed)
Please place referral to ENT for vertigo- request vestibular specialist in comments please. TY.

## 2016-10-06 ENCOUNTER — Encounter: Payer: Self-pay | Admitting: Family Medicine

## 2016-10-06 ENCOUNTER — Other Ambulatory Visit: Payer: Self-pay | Admitting: Family Medicine

## 2016-10-06 DIAGNOSIS — R42 Dizziness and giddiness: Secondary | ICD-10-CM

## 2016-10-06 NOTE — Progress Notes (Signed)
ENT referral placed. Pt notified via MyChart.

## 2016-11-02 DIAGNOSIS — H8302 Labyrinthitis, left ear: Secondary | ICD-10-CM | POA: Diagnosis not present

## 2016-11-02 DIAGNOSIS — Z0110 Encounter for examination of ears and hearing without abnormal findings: Secondary | ICD-10-CM | POA: Diagnosis not present

## 2016-12-28 ENCOUNTER — Encounter: Payer: Self-pay | Admitting: Family

## 2017-01-14 ENCOUNTER — Ambulatory Visit: Payer: BLUE CROSS/BLUE SHIELD | Admitting: Family

## 2017-01-14 ENCOUNTER — Encounter: Payer: Self-pay | Admitting: Family

## 2017-01-14 VITALS — BP 145/88 | HR 72 | Temp 98.2°F | Resp 18 | Ht 69.0 in | Wt 233.4 lb

## 2017-01-14 DIAGNOSIS — R42 Dizziness and giddiness: Secondary | ICD-10-CM | POA: Diagnosis not present

## 2017-01-14 DIAGNOSIS — H814 Vertigo of central origin: Secondary | ICD-10-CM

## 2017-01-14 NOTE — Progress Notes (Signed)
Subjective:    Patient ID: Adam Stewart, male    DOB: Apr 10, 1960, 56 y.o.   MRN: [id number]  HPI   Adam Stewart is a 56 yr old male who presents today with chief complaint of dizziness. Reports that dizziness has been present for 6 months.  Reports ongoing issues with balance, vision, nausea. Saw ENT (note reviewed) on 11/02/16- diagnosed with vestibular neuronitis/labyrinthitis.  He was advised to follow up as needed.    Feels like he is in a fog .  Denies spins.  Has blurred vision if he turns his head too quickly.  Had recent eye exam.  He reports that he underwent vestibular rehab without improvement of his symptoms.  In fact he felt that this made his symptoms worse.   Review of Systems See HPI  Past Medical History:  Diagnosis Date  . Anxiety      Social History   Socioeconomic History  . Marital status: Married    Spouse name: Not on file  . Number of children: 1  . Years of education: Not on file  . Highest education level: Not on file  Social Needs  . Financial resource strain: Not on file  . Food insecurity - worry: Not on file  . Food insecurity - inability: Not on file  . Transportation needs - medical: Not on file  . Transportation needs - non-medical: Not on file  Occupational History  . 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 Smoking status: Former Games developermoker  . Smokeless tobacco: Never Used  Substance and Sexual Activity  . Alcohol use: Yes    Alcohol/week: 6.0 oz    Types: 10 Standard drinks or equivalent per week  . Drug use: No  . Sexual activity: Not on file  Other Topics Concern  . Not on file  Social History Narrative   1 biological child, 2 step children          Past Surgical History:  Procedure Laterality Date  . KNEE ARTHROSCOPY  2011   right    Family History  Problem Relation Age of Onset  . Cancer Mother        breast  . Breast cancer Mother   . Diabetes Brother         type II  . Cancer Other        lung    Allergies  Allergen Reactions  . Bupropion Hcl Hives    Current Outpatient Medications on File Prior to Visit  Medication Sig Dispense Refill  . meloxicam (MOBIC) 7.5 MG tablet Take 7.5 mg daily as needed by mouth for pain.     No current facility-administered medications on file prior to visit.     BP (!) 145/88 (BP Location: Right Arm, Cuff Size: Large)   Pulse 72   Temp 98.2 F (36.8 C) (Oral)   Resp 18   Ht 5\' 9"  (1.753 m)   Wt 233 lb 6.4 oz (105.9 kg)   SpO2 99%   BMI 34.47 kg/m       Objective:   Physical Exam  Constitutional: He is oriented to person, place, and time. He appears well-developed and well-nourished. No distress.  HENT:  Head: Normocephalic and atraumatic.  Cardiovascular: Normal rate and regular rhythm.  No murmur heard. Pulmonary/Chest: Effort normal and breath sounds normal. No respiratory distress. He has no wheezes. He has no rales.  Musculoskeletal: He exhibits no edema.  Neurological: He is alert and oriented to person, place, and time. No cranial nerve deficit. He exhibits normal muscle tone. Coordination normal.  Reflex Scores:      Patellar reflexes are 2+ on the right side and 2+ on the left side. Skin: Skin is warm and dry.  Psychiatric: He has a normal mood and affect. His behavior is normal. Thought content normal.          Assessment & Plan:  Dizziness-uncontrolled.  We will obtain an MRI of the brain without contrast consider referral to neurology pending results of MRI.

## 2017-01-14 NOTE — Patient Instructions (Signed)
You will be contacted about your MRI.  

## 2017-01-15 ENCOUNTER — Ambulatory Visit (HOSPITAL_BASED_OUTPATIENT_CLINIC_OR_DEPARTMENT_OTHER)
Admission: RE | Admit: 2017-01-15 | Discharge: 2017-01-15 | Disposition: A | Discharge status: Home / Self Care | Payer: BLUE CROSS/BLUE SHIELD | Source: Ambulatory Visit | Attending: Family | Admitting: Family

## 2017-01-15 DIAGNOSIS — R42 Dizziness and giddiness: Secondary | ICD-10-CM | POA: Diagnosis not present

## 2017-01-15 DIAGNOSIS — M2548 Effusion, other site: Secondary | ICD-10-CM | POA: Diagnosis not present

## 2017-01-15 DIAGNOSIS — H8149 Vertigo of central origin, unspecified ear: Secondary | ICD-10-CM | POA: Diagnosis not present

## 2017-01-15 DIAGNOSIS — H814 Vertigo of central origin: Secondary | ICD-10-CM

## 2017-01-16 ENCOUNTER — Encounter: Payer: Self-pay | Admitting: Family

## 2017-01-16 DIAGNOSIS — R42 Dizziness and giddiness: Secondary | ICD-10-CM

## 2017-01-25 ENCOUNTER — Encounter: Payer: Self-pay | Admitting: Neurology

## 2017-04-11 ENCOUNTER — Ambulatory Visit: Payer: BLUE CROSS/BLUE SHIELD | Admitting: Neurology

## 2017-05-06 ENCOUNTER — Other Ambulatory Visit: Payer: Self-pay | Admitting: Physician Assistant

## 2017-05-06 DIAGNOSIS — D229 Melanocytic nevi, unspecified: Secondary | ICD-10-CM | POA: Diagnosis not present

## 2017-05-06 DIAGNOSIS — D485 Neoplasm of uncertain behavior of skin: Secondary | ICD-10-CM | POA: Diagnosis not present

## 2017-05-06 DIAGNOSIS — L821 Other seborrheic keratosis: Secondary | ICD-10-CM | POA: Diagnosis not present

## 2017-06-10 DIAGNOSIS — J019 Acute sinusitis, unspecified: Secondary | ICD-10-CM | POA: Diagnosis not present

## 2017-07-18 DIAGNOSIS — M9901 Segmental and somatic dysfunction of cervical region: Secondary | ICD-10-CM | POA: Diagnosis not present

## 2017-07-18 DIAGNOSIS — M9903 Segmental and somatic dysfunction of lumbar region: Secondary | ICD-10-CM | POA: Diagnosis not present

## 2017-07-18 DIAGNOSIS — M62830 Muscle spasm of back: Secondary | ICD-10-CM | POA: Diagnosis not present

## 2017-07-18 DIAGNOSIS — M9902 Segmental and somatic dysfunction of thoracic region: Secondary | ICD-10-CM | POA: Diagnosis not present

## 2018-12-06 DIAGNOSIS — Z03818 Encounter for observation for suspected exposure to other biological agents ruled out: Secondary | ICD-10-CM | POA: Diagnosis not present

## 2019-02-01 DIAGNOSIS — L821 Other seborrheic keratosis: Secondary | ICD-10-CM | POA: Diagnosis not present

## 2019-02-01 DIAGNOSIS — D229 Melanocytic nevi, unspecified: Secondary | ICD-10-CM | POA: Diagnosis not present

## 2019-03-12 DIAGNOSIS — Z20828 Contact with and (suspected) exposure to other viral communicable diseases: Secondary | ICD-10-CM | POA: Diagnosis not present

## 2019-05-19 DIAGNOSIS — S0501XA Injury of conjunctiva and corneal abrasion without foreign body, right eye, initial encounter: Secondary | ICD-10-CM | POA: Diagnosis not present

## 2019-05-19 DIAGNOSIS — H1131 Conjunctival hemorrhage, right eye: Secondary | ICD-10-CM | POA: Diagnosis not present

## 2020-06-17 DIAGNOSIS — Z1329 Encounter for screening for other suspected endocrine disorder: Secondary | ICD-10-CM | POA: Diagnosis not present

## 2020-06-17 DIAGNOSIS — Z13220 Encounter for screening for lipoid disorders: Secondary | ICD-10-CM | POA: Diagnosis not present

## 2020-06-17 DIAGNOSIS — Z0000 Encounter for general adult medical examination without abnormal findings: Secondary | ICD-10-CM | POA: Diagnosis not present

## 2020-06-17 DIAGNOSIS — F439 Reaction to severe stress, unspecified: Secondary | ICD-10-CM | POA: Diagnosis not present

## 2020-06-17 DIAGNOSIS — Z125 Encounter for screening for malignant neoplasm of prostate: Secondary | ICD-10-CM | POA: Diagnosis not present

## 2020-06-17 DIAGNOSIS — Z1211 Encounter for screening for malignant neoplasm of colon: Secondary | ICD-10-CM | POA: Diagnosis not present

## 2020-07-16 ENCOUNTER — Encounter: Payer: Self-pay | Admitting: Gastroenterology

## 2020-07-18 DIAGNOSIS — R5383 Other fatigue: Secondary | ICD-10-CM | POA: Diagnosis not present

## 2020-07-18 DIAGNOSIS — E782 Mixed hyperlipidemia: Secondary | ICD-10-CM | POA: Diagnosis not present

## 2020-07-18 DIAGNOSIS — I10 Essential (primary) hypertension: Secondary | ICD-10-CM | POA: Diagnosis not present

## 2020-08-05 DIAGNOSIS — M25511 Pain in right shoulder: Secondary | ICD-10-CM | POA: Diagnosis not present

## 2020-08-07 ENCOUNTER — Ambulatory Visit: Payer: BLUE CROSS/BLUE SHIELD | Admitting: Gastroenterology

## 2020-08-08 DIAGNOSIS — E291 Testicular hypofunction: Secondary | ICD-10-CM | POA: Diagnosis not present

## 2020-08-14 DIAGNOSIS — E291 Testicular hypofunction: Secondary | ICD-10-CM | POA: Diagnosis not present

## 2020-08-14 DIAGNOSIS — E782 Mixed hyperlipidemia: Secondary | ICD-10-CM | POA: Diagnosis not present

## 2020-09-09 DIAGNOSIS — M25511 Pain in right shoulder: Secondary | ICD-10-CM | POA: Diagnosis not present

## 2020-09-11 ENCOUNTER — Other Ambulatory Visit: Payer: Self-pay | Admitting: Sports Medicine

## 2020-09-11 DIAGNOSIS — M25511 Pain in right shoulder: Secondary | ICD-10-CM

## 2020-09-26 ENCOUNTER — Other Ambulatory Visit: Payer: BC Managed Care – PPO

## 2020-09-26 ENCOUNTER — Other Ambulatory Visit: Payer: Self-pay

## 2020-10-06 DIAGNOSIS — N486 Induration penis plastica: Secondary | ICD-10-CM | POA: Diagnosis not present

## 2020-10-06 DIAGNOSIS — E349 Endocrine disorder, unspecified: Secondary | ICD-10-CM | POA: Diagnosis not present

## 2020-10-06 DIAGNOSIS — N5201 Erectile dysfunction due to arterial insufficiency: Secondary | ICD-10-CM | POA: Diagnosis not present

## 2020-10-10 ENCOUNTER — Other Ambulatory Visit: Payer: Self-pay

## 2020-10-10 ENCOUNTER — Ambulatory Visit
Admission: RE | Admit: 2020-10-10 | Discharge: 2020-10-10 | Disposition: A | Discharge status: Home / Self Care | Payer: BC Managed Care – PPO | Source: Ambulatory Visit | Attending: Sports Medicine | Admitting: Sports Medicine

## 2020-10-10 DIAGNOSIS — M25511 Pain in right shoulder: Secondary | ICD-10-CM

## 2020-10-10 DIAGNOSIS — S46011A Strain of muscle(s) and tendon(s) of the rotator cuff of right shoulder, initial encounter: Secondary | ICD-10-CM | POA: Diagnosis not present

## 2020-10-10 MED ORDER — IOPAMIDOL (ISOVUE-M 200) INJECTION 41%
13.0000 mL | Freq: Once | INTRAMUSCULAR | Status: AC
  Administered 2020-10-10: 13 mL via INTRA_ARTICULAR

## 2020-10-15 DIAGNOSIS — S46011A Strain of muscle(s) and tendon(s) of the rotator cuff of right shoulder, initial encounter: Secondary | ICD-10-CM | POA: Diagnosis not present

## 2020-11-06 DIAGNOSIS — M24111 Other articular cartilage disorders, right shoulder: Secondary | ICD-10-CM | POA: Diagnosis not present

## 2020-11-06 DIAGNOSIS — G8918 Other acute postprocedural pain: Secondary | ICD-10-CM | POA: Diagnosis not present

## 2020-11-06 DIAGNOSIS — M75111 Incomplete rotator cuff tear or rupture of right shoulder, not specified as traumatic: Secondary | ICD-10-CM | POA: Diagnosis not present

## 2020-11-06 DIAGNOSIS — M7541 Impingement syndrome of right shoulder: Secondary | ICD-10-CM | POA: Diagnosis not present

## 2020-11-19 DIAGNOSIS — M24111 Other articular cartilage disorders, right shoulder: Secondary | ICD-10-CM | POA: Diagnosis not present

## 2020-12-17 DIAGNOSIS — S46011D Strain of muscle(s) and tendon(s) of the rotator cuff of right shoulder, subsequent encounter: Secondary | ICD-10-CM | POA: Diagnosis not present

## 2020-12-25 DIAGNOSIS — M25611 Stiffness of right shoulder, not elsewhere classified: Secondary | ICD-10-CM | POA: Diagnosis not present

## 2020-12-25 DIAGNOSIS — M25511 Pain in right shoulder: Secondary | ICD-10-CM | POA: Diagnosis not present

## 2020-12-25 DIAGNOSIS — M6281 Muscle weakness (generalized): Secondary | ICD-10-CM | POA: Diagnosis not present

## 2020-12-30 DIAGNOSIS — M6281 Muscle weakness (generalized): Secondary | ICD-10-CM | POA: Diagnosis not present

## 2020-12-30 DIAGNOSIS — M25611 Stiffness of right shoulder, not elsewhere classified: Secondary | ICD-10-CM | POA: Diagnosis not present

## 2020-12-30 DIAGNOSIS — M25511 Pain in right shoulder: Secondary | ICD-10-CM | POA: Diagnosis not present

## 2020-12-30 DIAGNOSIS — S46011D Strain of muscle(s) and tendon(s) of the rotator cuff of right shoulder, subsequent encounter: Secondary | ICD-10-CM | POA: Diagnosis not present

## 2021-01-06 DIAGNOSIS — S46011D Strain of muscle(s) and tendon(s) of the rotator cuff of right shoulder, subsequent encounter: Secondary | ICD-10-CM | POA: Diagnosis not present

## 2021-01-06 DIAGNOSIS — M25511 Pain in right shoulder: Secondary | ICD-10-CM | POA: Diagnosis not present

## 2021-01-06 DIAGNOSIS — M25611 Stiffness of right shoulder, not elsewhere classified: Secondary | ICD-10-CM | POA: Diagnosis not present

## 2021-01-06 DIAGNOSIS — M6281 Muscle weakness (generalized): Secondary | ICD-10-CM | POA: Diagnosis not present

## 2021-01-07 DIAGNOSIS — M25511 Pain in right shoulder: Secondary | ICD-10-CM | POA: Diagnosis not present

## 2021-01-07 DIAGNOSIS — S46011D Strain of muscle(s) and tendon(s) of the rotator cuff of right shoulder, subsequent encounter: Secondary | ICD-10-CM | POA: Diagnosis not present

## 2021-01-07 DIAGNOSIS — M25611 Stiffness of right shoulder, not elsewhere classified: Secondary | ICD-10-CM | POA: Diagnosis not present

## 2021-01-07 DIAGNOSIS — M6281 Muscle weakness (generalized): Secondary | ICD-10-CM | POA: Diagnosis not present

## 2021-01-14 DIAGNOSIS — M25611 Stiffness of right shoulder, not elsewhere classified: Secondary | ICD-10-CM | POA: Diagnosis not present

## 2021-01-14 DIAGNOSIS — M25511 Pain in right shoulder: Secondary | ICD-10-CM | POA: Diagnosis not present

## 2021-01-14 DIAGNOSIS — S46011D Strain of muscle(s) and tendon(s) of the rotator cuff of right shoulder, subsequent encounter: Secondary | ICD-10-CM | POA: Diagnosis not present

## 2021-01-14 DIAGNOSIS — M6281 Muscle weakness (generalized): Secondary | ICD-10-CM | POA: Diagnosis not present

## 2021-01-19 DIAGNOSIS — E291 Testicular hypofunction: Secondary | ICD-10-CM | POA: Diagnosis not present

## 2021-01-19 DIAGNOSIS — I10 Essential (primary) hypertension: Secondary | ICD-10-CM | POA: Diagnosis not present

## 2021-01-19 DIAGNOSIS — E782 Mixed hyperlipidemia: Secondary | ICD-10-CM | POA: Diagnosis not present

## 2021-01-21 DIAGNOSIS — S46011D Strain of muscle(s) and tendon(s) of the rotator cuff of right shoulder, subsequent encounter: Secondary | ICD-10-CM | POA: Diagnosis not present

## 2021-01-21 DIAGNOSIS — M25511 Pain in right shoulder: Secondary | ICD-10-CM | POA: Diagnosis not present

## 2021-01-21 DIAGNOSIS — M6281 Muscle weakness (generalized): Secondary | ICD-10-CM | POA: Diagnosis not present

## 2021-01-21 DIAGNOSIS — M25611 Stiffness of right shoulder, not elsewhere classified: Secondary | ICD-10-CM | POA: Diagnosis not present

## 2021-07-10 DIAGNOSIS — Z125 Encounter for screening for malignant neoplasm of prostate: Secondary | ICD-10-CM | POA: Diagnosis not present

## 2021-07-10 DIAGNOSIS — G729 Myopathy, unspecified: Secondary | ICD-10-CM | POA: Diagnosis not present

## 2021-07-10 DIAGNOSIS — Z0000 Encounter for general adult medical examination without abnormal findings: Secondary | ICD-10-CM | POA: Diagnosis not present

## 2021-07-10 DIAGNOSIS — I10 Essential (primary) hypertension: Secondary | ICD-10-CM | POA: Diagnosis not present

## 2021-07-10 DIAGNOSIS — E782 Mixed hyperlipidemia: Secondary | ICD-10-CM | POA: Diagnosis not present

## 2021-07-10 DIAGNOSIS — F439 Reaction to severe stress, unspecified: Secondary | ICD-10-CM | POA: Diagnosis not present

## 2021-08-07 DIAGNOSIS — I10 Essential (primary) hypertension: Secondary | ICD-10-CM | POA: Diagnosis not present

## 2021-08-07 DIAGNOSIS — R413 Other amnesia: Secondary | ICD-10-CM | POA: Diagnosis not present

## 2021-08-24 ENCOUNTER — Encounter: Payer: Self-pay | Admitting: Gastroenterology

## 2021-11-19 DIAGNOSIS — M5441 Lumbago with sciatica, right side: Secondary | ICD-10-CM | POA: Diagnosis not present

## 2021-11-19 DIAGNOSIS — K209 Esophagitis, unspecified without bleeding: Secondary | ICD-10-CM | POA: Diagnosis not present

## 2021-11-19 DIAGNOSIS — M5442 Lumbago with sciatica, left side: Secondary | ICD-10-CM | POA: Diagnosis not present

## 2021-11-24 ENCOUNTER — Other Ambulatory Visit: Payer: Self-pay | Admitting: Family Medicine

## 2021-11-24 ENCOUNTER — Ambulatory Visit
Admission: RE | Admit: 2021-11-24 | Discharge: 2021-11-24 | Disposition: A | Discharge status: Home / Self Care | Payer: BC Managed Care – PPO | Source: Ambulatory Visit | Attending: Family Medicine | Admitting: Family Medicine

## 2021-11-24 DIAGNOSIS — M5440 Lumbago with sciatica, unspecified side: Secondary | ICD-10-CM

## 2021-12-01 DIAGNOSIS — M545 Low back pain, unspecified: Secondary | ICD-10-CM | POA: Diagnosis not present

## 2021-12-03 ENCOUNTER — Ambulatory Visit: Payer: BC Managed Care – PPO

## 2021-12-14 DIAGNOSIS — M5441 Lumbago with sciatica, right side: Secondary | ICD-10-CM | POA: Diagnosis not present

## 2021-12-14 DIAGNOSIS — M5442 Lumbago with sciatica, left side: Secondary | ICD-10-CM | POA: Diagnosis not present

## 2021-12-22 DIAGNOSIS — M5441 Lumbago with sciatica, right side: Secondary | ICD-10-CM | POA: Diagnosis not present

## 2021-12-22 DIAGNOSIS — M5442 Lumbago with sciatica, left side: Secondary | ICD-10-CM | POA: Diagnosis not present

## 2021-12-23 ENCOUNTER — Other Ambulatory Visit: Payer: Self-pay | Admitting: Physical Medicine and Rehabilitation

## 2021-12-23 ENCOUNTER — Ambulatory Visit
Admission: RE | Admit: 2021-12-23 | Discharge: 2021-12-23 | Disposition: A | Discharge status: Home / Self Care | Payer: BC Managed Care – PPO | Source: Ambulatory Visit | Attending: Physical Medicine and Rehabilitation | Admitting: Physical Medicine and Rehabilitation

## 2021-12-23 DIAGNOSIS — M48062 Spinal stenosis, lumbar region with neurogenic claudication: Secondary | ICD-10-CM

## 2021-12-23 DIAGNOSIS — M4316 Spondylolisthesis, lumbar region: Secondary | ICD-10-CM | POA: Diagnosis not present

## 2021-12-28 DIAGNOSIS — M5441 Lumbago with sciatica, right side: Secondary | ICD-10-CM | POA: Diagnosis not present

## 2021-12-28 DIAGNOSIS — M5442 Lumbago with sciatica, left side: Secondary | ICD-10-CM | POA: Diagnosis not present

## 2021-12-31 ENCOUNTER — Other Ambulatory Visit: Payer: Self-pay | Admitting: Physical Medicine and Rehabilitation

## 2021-12-31 DIAGNOSIS — M48062 Spinal stenosis, lumbar region with neurogenic claudication: Secondary | ICD-10-CM

## 2022-01-05 DIAGNOSIS — M5442 Lumbago with sciatica, left side: Secondary | ICD-10-CM | POA: Diagnosis not present

## 2022-01-05 DIAGNOSIS — M5441 Lumbago with sciatica, right side: Secondary | ICD-10-CM | POA: Diagnosis not present

## 2022-01-07 DIAGNOSIS — M5441 Lumbago with sciatica, right side: Secondary | ICD-10-CM | POA: Diagnosis not present

## 2022-01-07 DIAGNOSIS — M5442 Lumbago with sciatica, left side: Secondary | ICD-10-CM | POA: Diagnosis not present

## 2022-01-08 DIAGNOSIS — E782 Mixed hyperlipidemia: Secondary | ICD-10-CM | POA: Diagnosis not present

## 2022-01-08 DIAGNOSIS — K209 Esophagitis, unspecified without bleeding: Secondary | ICD-10-CM | POA: Diagnosis not present

## 2022-01-08 DIAGNOSIS — M5441 Lumbago with sciatica, right side: Secondary | ICD-10-CM | POA: Diagnosis not present

## 2022-01-08 DIAGNOSIS — I10 Essential (primary) hypertension: Secondary | ICD-10-CM | POA: Diagnosis not present

## 2022-01-10 IMAGING — MR MR SHOULDER*R* W/CM
5 series · 39 of 40 positions shown · IV contrast (agent unspecified)
Comparison: None.

CLINICAL DATA: Right shoulder pain after lifting injury 5 months
ago

EXAM:
MR ARTHROGRAM OF THE RIGHT SHOULDER
TECHNIQUE: Multiplanar, multisequence MR imaging of the right shoulder was
performed following the administration of intra-articular contrast.
CONTRAST:  See Injection Documentation.

[Series 3: T1 fat-sat · axial · 4.0mm · 0.27mm/px · z∈[-19,+72]mm · 9 of 21 slices shown (1 of 3)]
[im 1/21]
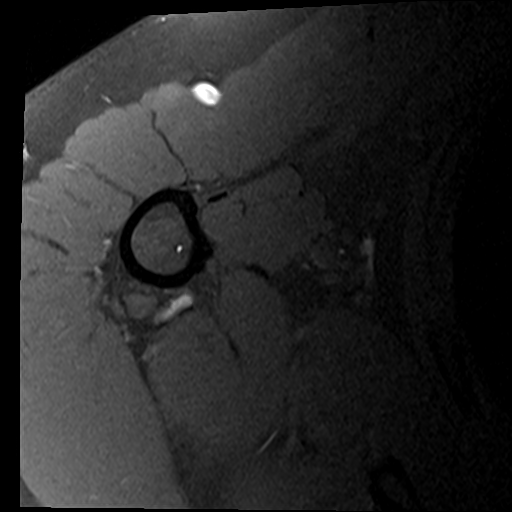
[im 3/21]
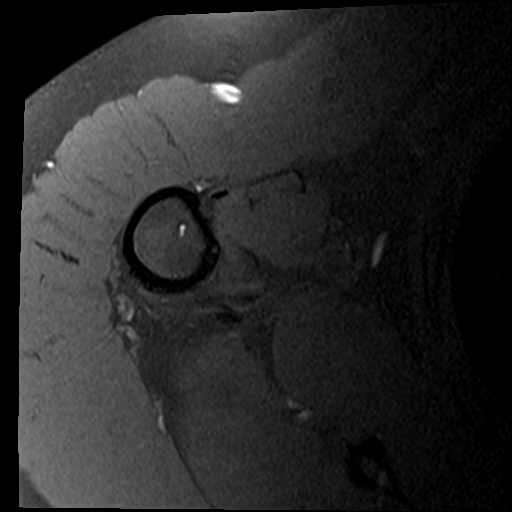
[im 5/21]
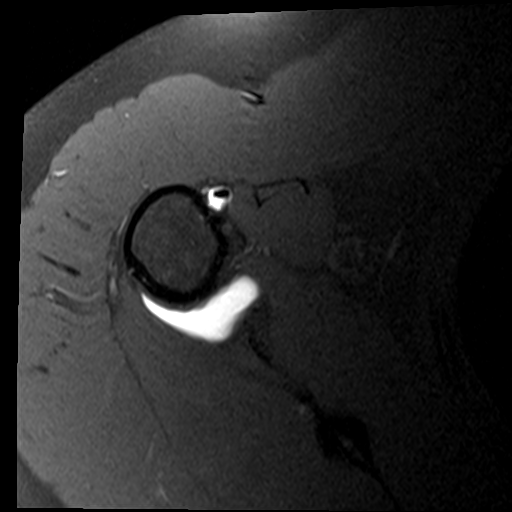
[im 7/21]
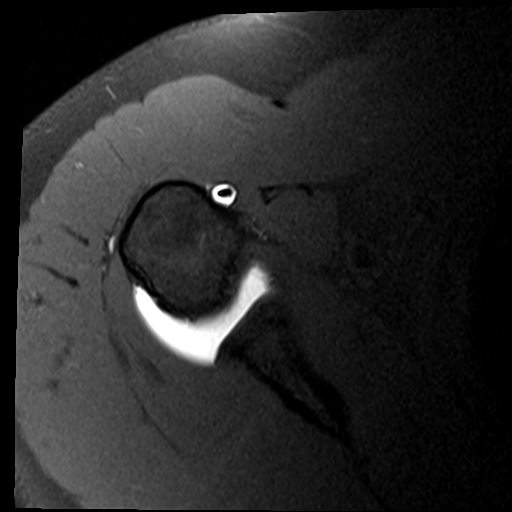
[im 9/21]
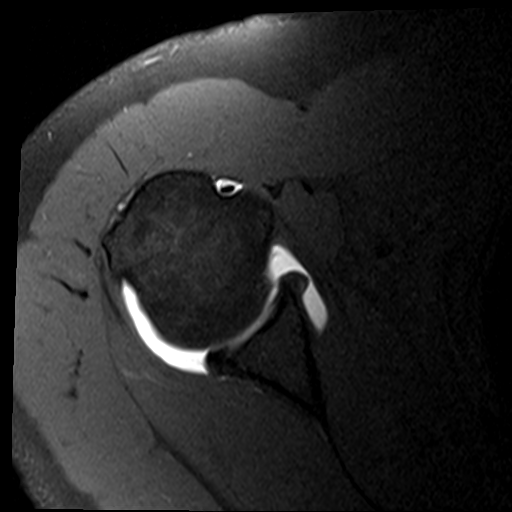
[im 12/21]
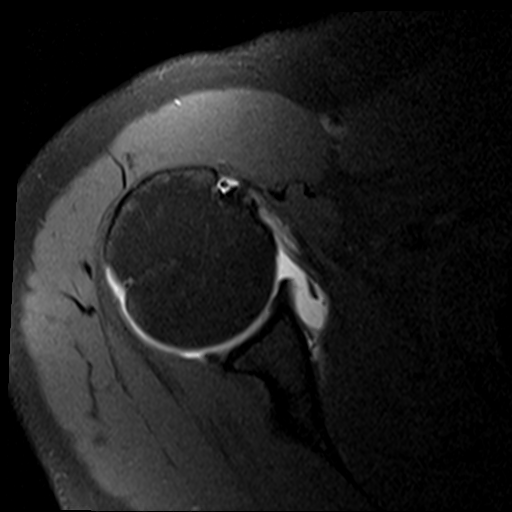
[im 14/21]
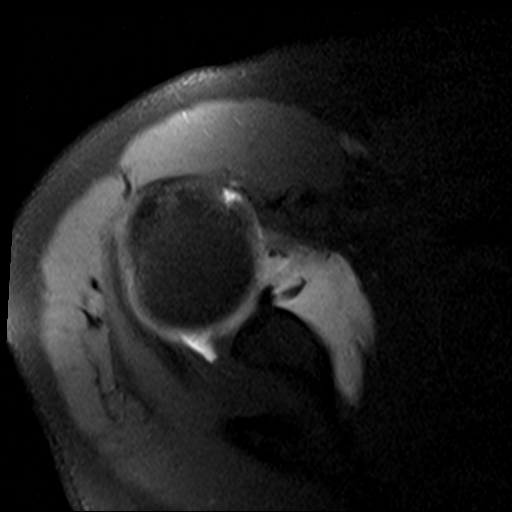
[im 18/21]
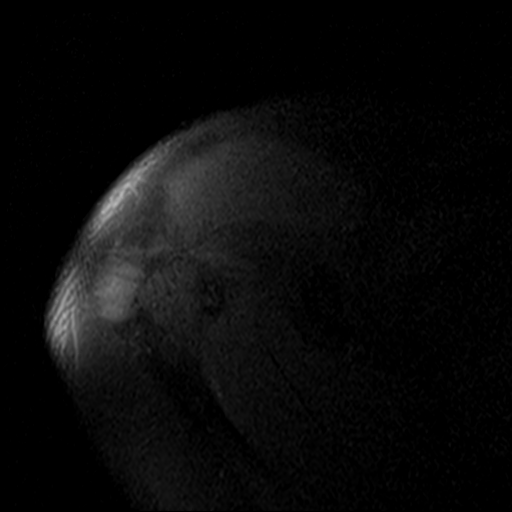
[im 21/21]
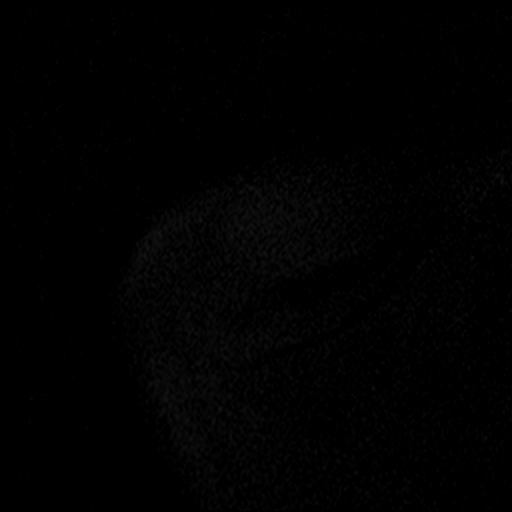

[Series 4: T2 fat-sat · oblique · 4.0mm · 0.55mm/px · 9 of 20 slices shown (1 of 2)]
[im 1/20]
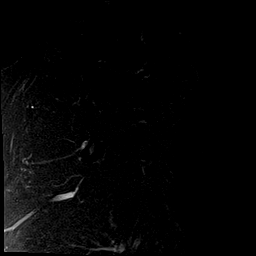
[im 3/20]
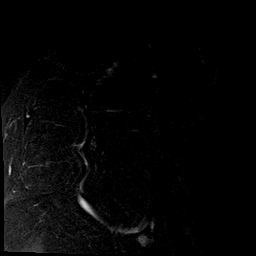
[im 5/20]
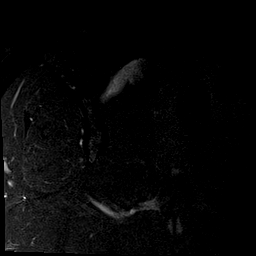
[im 8/20]
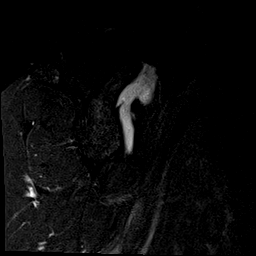
[im 10/20]
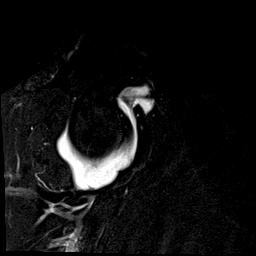
[im 12/20]
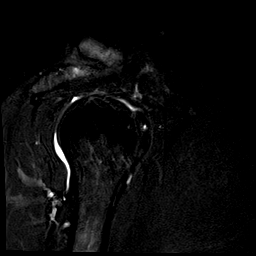
[im 15/20]
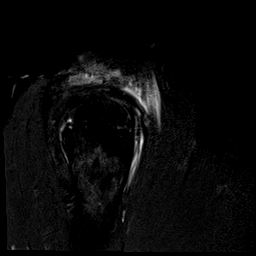
[im 17/20]
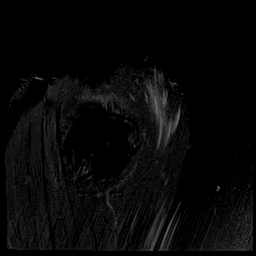
[im 20/20]
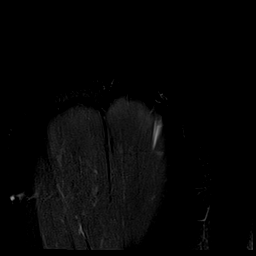

[Series 5: T1 fat-sat · oblique · 4.0mm · 0.55mm/px · 7 of 16 slices shown (2 of 3)]
[im 1/16]
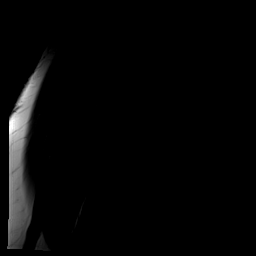
[im 3/16]
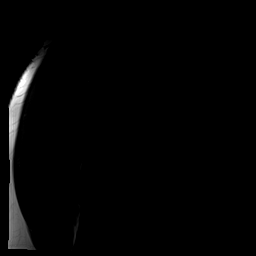
[im 6/16]
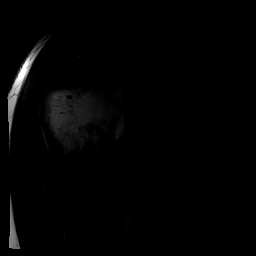
[im 8/16]
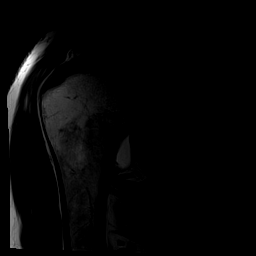
[im 11/16]
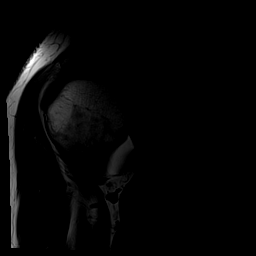
[im 13/16]
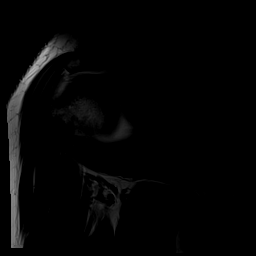
[im 16/16]
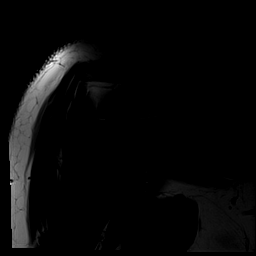

[Series 6: T1 fat-sat · oblique · 4.0mm · 0.55mm/px · 7 of 16 slices shown (3 of 3)]
[im 1/16]
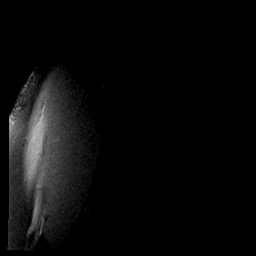
[im 3/16]
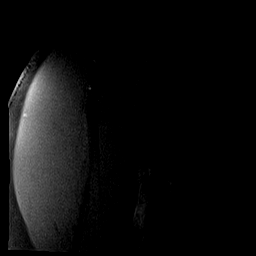
[im 6/16]
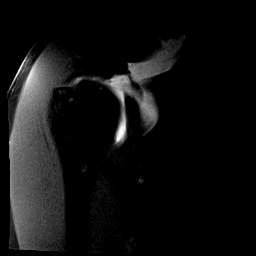
[im 8/16]
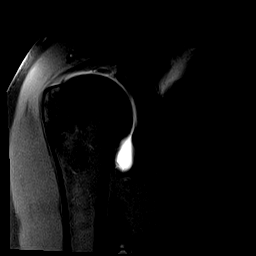
[im 11/16]
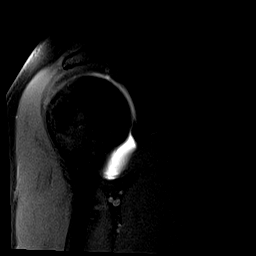
[im 13/16]
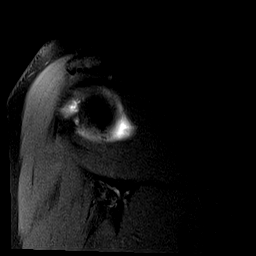
[im 16/16]
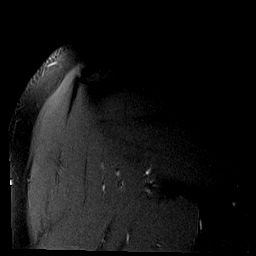

[Series 7: T2 fat-sat · oblique · 4.0mm · 0.55mm/px · 7 of 16 slices shown (2 of 2)]
[im 1/16]
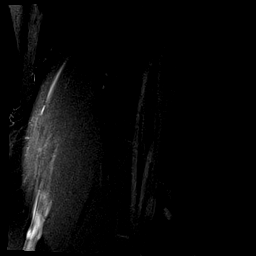
[im 3/16]
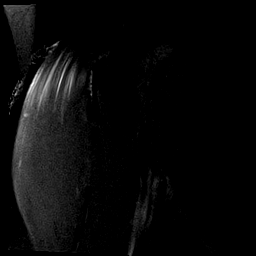
[im 6/16]
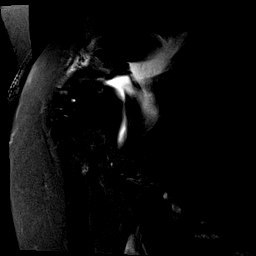
[im 8/16]
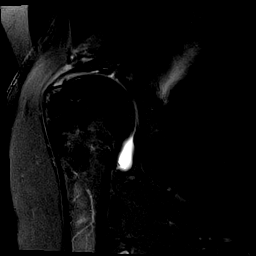
[im 11/16]
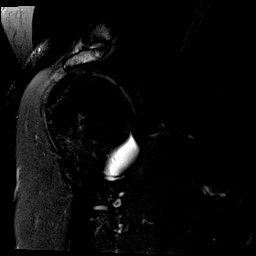
[im 13/16]
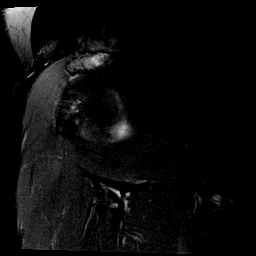
[im 16/16]
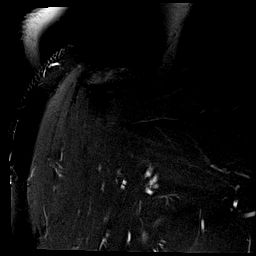

[39 of 40 positions shown; findings below may reference images not displayed]

FINDINGS: Technical note: Despite efforts by the technologist and patient,
motion artifact is present on today's exam and could not be
eliminated. This reduces exam sensitivity and specificity.

Rotator cuff: Partial thickness articular surface and insertional
tearing of the supraspinatus tendon with involvement of greater than
75% of the tendon depth along the posterior margin of the tear
(series 7, images 8-10). Subtle overlying bursal surface
irregularity. No full-thickness or retracted component is present.
High-grade partial thickness interstitial tearing of the mid
subscapularis tendon. Infraspinatus and teres minor tendons are
intact. No full thickness rotator cuff tear.

Muscles: Preserved bulk and signal intensity of the rotator cuff
musculature without edema, atrophy, or fatty infiltration.

Biceps long head: Intact.

Acromioclavicular Joint: Mild arthropathy of the AC joint. Trace
fluid within the subacromial-subdeltoid bursa. No contrast extends
into the bursal space.

Glenohumeral Joint: Well distended with injected contrast. No
cartilage defect.

Labrum: Labrum intact without tear.

Bones: Reactive subchondral marrow signal changes within the
acromion and distal clavicle. No fracture identified. No
dislocation. No suspicious bone lesion.

Other: None.
IMPRESSION: 1. High-grade partial-thickness tears of the supraspinatus and
subscapularis tendons. No full-thickness rotator cuff tear.
2. Mild AC joint arthropathy. Prominent marrow edema within the
acromion and distal clavicle, which may be reactive.

## 2022-01-12 DIAGNOSIS — M5442 Lumbago with sciatica, left side: Secondary | ICD-10-CM | POA: Diagnosis not present

## 2022-01-12 DIAGNOSIS — M5441 Lumbago with sciatica, right side: Secondary | ICD-10-CM | POA: Diagnosis not present

## 2022-01-20 ENCOUNTER — Ambulatory Visit
Admission: RE | Admit: 2022-01-20 | Discharge: 2022-01-20 | Disposition: A | Discharge status: Home / Self Care | Payer: BC Managed Care – PPO | Source: Ambulatory Visit | Attending: Physical Medicine and Rehabilitation | Admitting: Physical Medicine and Rehabilitation

## 2022-01-20 DIAGNOSIS — M4316 Spondylolisthesis, lumbar region: Secondary | ICD-10-CM | POA: Diagnosis not present

## 2022-01-20 DIAGNOSIS — M48062 Spinal stenosis, lumbar region with neurogenic claudication: Secondary | ICD-10-CM

## 2022-01-20 DIAGNOSIS — M48061 Spinal stenosis, lumbar region without neurogenic claudication: Secondary | ICD-10-CM | POA: Diagnosis not present

## 2022-01-20 DIAGNOSIS — M545 Low back pain, unspecified: Secondary | ICD-10-CM | POA: Diagnosis not present

## 2022-02-01 DIAGNOSIS — M48062 Spinal stenosis, lumbar region with neurogenic claudication: Secondary | ICD-10-CM | POA: Diagnosis not present

## 2022-03-24 DIAGNOSIS — R1314 Dysphagia, pharyngoesophageal phase: Secondary | ICD-10-CM | POA: Diagnosis not present

## 2022-03-24 DIAGNOSIS — Z1211 Encounter for screening for malignant neoplasm of colon: Secondary | ICD-10-CM | POA: Diagnosis not present

## 2022-03-24 DIAGNOSIS — R1310 Dysphagia, unspecified: Secondary | ICD-10-CM | POA: Diagnosis not present

## 2022-04-28 DIAGNOSIS — K317 Polyp of stomach and duodenum: Secondary | ICD-10-CM | POA: Diagnosis not present

## 2022-04-28 DIAGNOSIS — D125 Benign neoplasm of sigmoid colon: Secondary | ICD-10-CM | POA: Diagnosis not present

## 2022-04-28 DIAGNOSIS — K209 Esophagitis, unspecified without bleeding: Secondary | ICD-10-CM | POA: Diagnosis not present

## 2022-04-28 DIAGNOSIS — Z1211 Encounter for screening for malignant neoplasm of colon: Secondary | ICD-10-CM | POA: Diagnosis not present

## 2022-04-28 DIAGNOSIS — R1314 Dysphagia, pharyngoesophageal phase: Secondary | ICD-10-CM | POA: Diagnosis not present

## 2022-04-28 DIAGNOSIS — K208 Other esophagitis without bleeding: Secondary | ICD-10-CM | POA: Diagnosis not present

## 2022-04-28 DIAGNOSIS — K648 Other hemorrhoids: Secondary | ICD-10-CM | POA: Diagnosis not present

## 2022-07-13 ENCOUNTER — Ambulatory Visit
Admission: RE | Admit: 2022-07-13 | Discharge: 2022-07-13 | Disposition: A | Discharge status: Home / Self Care | Payer: BC Managed Care – PPO | Source: Ambulatory Visit | Attending: Family Medicine | Admitting: Family Medicine

## 2022-07-13 ENCOUNTER — Other Ambulatory Visit: Payer: Self-pay | Admitting: Family Medicine

## 2022-07-13 DIAGNOSIS — Z125 Encounter for screening for malignant neoplasm of prostate: Secondary | ICD-10-CM | POA: Diagnosis not present

## 2022-07-13 DIAGNOSIS — R0602 Shortness of breath: Secondary | ICD-10-CM | POA: Diagnosis not present

## 2022-07-13 DIAGNOSIS — R053 Chronic cough: Secondary | ICD-10-CM | POA: Diagnosis not present

## 2022-07-13 DIAGNOSIS — F439 Reaction to severe stress, unspecified: Secondary | ICD-10-CM | POA: Diagnosis not present

## 2022-07-13 DIAGNOSIS — G729 Myopathy, unspecified: Secondary | ICD-10-CM | POA: Diagnosis not present

## 2022-07-13 DIAGNOSIS — Z0000 Encounter for general adult medical examination without abnormal findings: Secondary | ICD-10-CM | POA: Diagnosis not present

## 2022-07-13 DIAGNOSIS — E782 Mixed hyperlipidemia: Secondary | ICD-10-CM | POA: Diagnosis not present

## 2022-07-13 DIAGNOSIS — I10 Essential (primary) hypertension: Secondary | ICD-10-CM | POA: Diagnosis not present

## 2022-08-23 DIAGNOSIS — L728 Other follicular cysts of the skin and subcutaneous tissue: Secondary | ICD-10-CM | POA: Diagnosis not present

## 2022-08-23 DIAGNOSIS — L814 Other melanin hyperpigmentation: Secondary | ICD-10-CM | POA: Diagnosis not present

## 2022-08-23 DIAGNOSIS — D2371 Other benign neoplasm of skin of right lower limb, including hip: Secondary | ICD-10-CM | POA: Diagnosis not present

## 2022-08-23 DIAGNOSIS — L02821 Furuncle of head [any part, except face]: Secondary | ICD-10-CM | POA: Diagnosis not present

## 2022-12-09 ENCOUNTER — Ambulatory Visit: Payer: BC Managed Care – PPO | Admitting: Podiatry

## 2022-12-09 ENCOUNTER — Encounter: Payer: Self-pay | Admitting: Podiatry

## 2022-12-09 ENCOUNTER — Ambulatory Visit: Payer: BC Managed Care – PPO

## 2022-12-09 DIAGNOSIS — M79671 Pain in right foot: Secondary | ICD-10-CM

## 2022-12-09 DIAGNOSIS — M7751 Other enthesopathy of right foot: Secondary | ICD-10-CM | POA: Diagnosis not present

## 2022-12-09 MED ORDER — TRIAMCINOLONE ACETONIDE 10 MG/ML IJ SUSP
10.0000 mg | Freq: Once | INTRAMUSCULAR | Status: AC
  Administered 2022-12-09: 10 mg via INTRA_ARTICULAR

## 2022-12-10 NOTE — Progress Notes (Signed)
Subjective:   Patient ID: Adam Stewart, male   DOB: 62 y.o.   MRN: [id number]   HPI Patient presents stating that he has had a lot of pain in his right foot and its gotten worse over the last 6 months to a year.  States that he remembers an injury years and years ago and it has been a gradual process since that time.  Patient does not smoke likes to be active   Review of Systems  All other systems reviewed and are negative.       Objective:  Physical Exam Vitals and nursing note reviewed.  Constitutional:      Appearance: He is well-developed.  Pulmonary:     Effort: Pulmonary effort is normal.  Musculoskeletal:        General: Normal range of motion.  Skin:    General: Skin is warm.  Neurological:     Mental Status: He is alert.     Neurovascular status intact muscle strength found to be adequate range of motion within normal limits with patient found to have inflammation pain second metatarsal phalangeal joint mild third with indications of arthritis around the joint surface.  Patient has good digital perfusion well-oriented x 3     Assessment:  Acute capsulitis second MPJ right fluid buildup around the joint surface     Plan:  H&P reviewed condition recommended that he understand about arthritis and reviewed x-ray went ahead today did sterile anesthesia of the area sterile prep aspirated the joint getting out a small amount of clear fluid injected with quarter cc dexamethasone Kenalog applied thick plantar padding rigid bottom shoes reappoint may require surgical intervention  X-rays indicate there is narrowing of the joint surface second MPJ right history of trauma to the joint

## 2022-12-23 DIAGNOSIS — K648 Other hemorrhoids: Secondary | ICD-10-CM | POA: Diagnosis not present

## 2022-12-30 ENCOUNTER — Ambulatory Visit: Payer: BC Managed Care – PPO | Admitting: Podiatry

## 2022-12-30 ENCOUNTER — Encounter: Payer: Self-pay | Admitting: Podiatry

## 2022-12-30 VITALS — Ht 69.0 in | Wt 233.4 lb

## 2022-12-30 DIAGNOSIS — M7751 Other enthesopathy of right foot: Secondary | ICD-10-CM | POA: Diagnosis not present

## 2022-12-30 DIAGNOSIS — M205X1 Other deformities of toe(s) (acquired), right foot: Secondary | ICD-10-CM

## 2022-12-30 NOTE — Progress Notes (Signed)
Subjective:   Patient ID: Adam Stewart, male   DOB: 62 y.o.   MRN: [id number]   HPI Patient states he is improving still having moderate discomfort but quite a bit better than it was   ROS      Objective:  Physical Exam  Neurovascular status intact inflammation around the plantar heel right improved but still painful with deep palpation     Assessment:  Plantar fasciitis bilateral with arch instability flattening of the arch     Plan:  H&P reviewed discussed the importance of stretching physical therapy shoe gear modifications and I discussed orthotics and patient wants them made and patient was evaluated by pedorthist who agrees and casted for functional orthotic devices

## 2023-01-04 DIAGNOSIS — I10 Essential (primary) hypertension: Secondary | ICD-10-CM | POA: Diagnosis not present

## 2023-01-04 DIAGNOSIS — F439 Reaction to severe stress, unspecified: Secondary | ICD-10-CM | POA: Diagnosis not present

## 2023-01-04 DIAGNOSIS — G729 Myopathy, unspecified: Secondary | ICD-10-CM | POA: Diagnosis not present

## 2023-01-04 DIAGNOSIS — E782 Mixed hyperlipidemia: Secondary | ICD-10-CM | POA: Diagnosis not present

## 2023-01-13 DIAGNOSIS — K648 Other hemorrhoids: Secondary | ICD-10-CM | POA: Diagnosis not present

## 2023-02-09 DIAGNOSIS — K649 Unspecified hemorrhoids: Secondary | ICD-10-CM | POA: Diagnosis not present

## 2023-06-02 ENCOUNTER — Other Ambulatory Visit: Payer: Self-pay | Admitting: Internal Medicine

## 2023-06-02 ENCOUNTER — Ambulatory Visit
Admission: RE | Admit: 2023-06-02 | Discharge: 2023-06-02 | Disposition: A | Discharge status: Home / Self Care | Source: Ambulatory Visit | Attending: Internal Medicine | Admitting: Internal Medicine

## 2023-06-02 DIAGNOSIS — M19042 Primary osteoarthritis, left hand: Secondary | ICD-10-CM | POA: Diagnosis not present

## 2023-06-02 DIAGNOSIS — M79645 Pain in left finger(s): Secondary | ICD-10-CM

## 2023-06-06 DIAGNOSIS — Z7185 Encounter for immunization safety counseling: Secondary | ICD-10-CM | POA: Diagnosis not present

## 2023-06-06 DIAGNOSIS — E782 Mixed hyperlipidemia: Secondary | ICD-10-CM | POA: Diagnosis not present

## 2023-06-06 DIAGNOSIS — Z79899 Other long term (current) drug therapy: Secondary | ICD-10-CM | POA: Diagnosis not present

## 2023-06-06 DIAGNOSIS — I10 Essential (primary) hypertension: Secondary | ICD-10-CM | POA: Diagnosis not present

## 2023-06-30 DIAGNOSIS — L738 Other specified follicular disorders: Secondary | ICD-10-CM | POA: Diagnosis not present

## 2023-06-30 DIAGNOSIS — L720 Epidermal cyst: Secondary | ICD-10-CM | POA: Diagnosis not present

## 2023-07-11 DIAGNOSIS — L720 Epidermal cyst: Secondary | ICD-10-CM | POA: Diagnosis not present

## 2023-07-21 DIAGNOSIS — L720 Epidermal cyst: Secondary | ICD-10-CM | POA: Diagnosis not present

## 2023-07-26 DIAGNOSIS — D229 Melanocytic nevi, unspecified: Secondary | ICD-10-CM | POA: Diagnosis not present

## 2023-07-26 DIAGNOSIS — L814 Other melanin hyperpigmentation: Secondary | ICD-10-CM | POA: Diagnosis not present

## 2023-07-26 DIAGNOSIS — L738 Other specified follicular disorders: Secondary | ICD-10-CM | POA: Diagnosis not present

## 2023-08-08 DIAGNOSIS — M25512 Pain in left shoulder: Secondary | ICD-10-CM | POA: Diagnosis not present

## 2023-08-30 DIAGNOSIS — Z13220 Encounter for screening for lipoid disorders: Secondary | ICD-10-CM | POA: Diagnosis not present

## 2023-08-30 DIAGNOSIS — Z0000 Encounter for general adult medical examination without abnormal findings: Secondary | ICD-10-CM | POA: Diagnosis not present

## 2023-09-05 DIAGNOSIS — M25512 Pain in left shoulder: Secondary | ICD-10-CM | POA: Diagnosis not present

## 2023-09-15 DIAGNOSIS — M4726 Other spondylosis with radiculopathy, lumbar region: Secondary | ICD-10-CM | POA: Diagnosis not present

## 2023-09-26 DIAGNOSIS — M5416 Radiculopathy, lumbar region: Secondary | ICD-10-CM | POA: Diagnosis not present

## 2023-10-04 DIAGNOSIS — M5416 Radiculopathy, lumbar region: Secondary | ICD-10-CM | POA: Diagnosis not present

## 2023-10-11 DIAGNOSIS — M5416 Radiculopathy, lumbar region: Secondary | ICD-10-CM | POA: Diagnosis not present

## 2023-10-13 DIAGNOSIS — M4120 Other idiopathic scoliosis, site unspecified: Secondary | ICD-10-CM | POA: Diagnosis not present

## 2023-10-13 DIAGNOSIS — M47816 Spondylosis without myelopathy or radiculopathy, lumbar region: Secondary | ICD-10-CM | POA: Diagnosis not present
